# Patient Record
Sex: Male | Born: 1960
Health system: Southern US, Community
[De-identification: ages and names within clinical notes are randomized; demographics above are authoritative.]

---

## 2020-03-01 ENCOUNTER — Ambulatory Visit: Payer: Medicaid Other | Attending: Internal Medicine

## 2020-03-01 DIAGNOSIS — Z23 Encounter for immunization: Secondary | ICD-10-CM

## 2020-03-01 NOTE — Progress Notes (Signed)
   Covid-19 Vaccination Clinic  Name:  Zyan Coby    MRN: [id number] DOB: 03-13-1961  03/01/2020  Mr. Kappes was observed post Covid-19 immunization for 15 minutes without incident. He was provided with Vaccine Information Sheet and instruction to access the V-Safe system.   Mr. Kornegay was instructed to call 911 with any severe reactions post vaccine: Marland Kitchen Difficulty breathing  . Swelling of face and throat  . A fast heartbeat  . A bad rash all over body  . Dizziness and weakness   Immunizations Administered    Name Date Dose VIS Date Route   Pfizer COVID-19 Vaccine 03/01/2020 10:43 AM 0.3 mL 11/25/2019 Intramuscular   Manufacturer: [employer], [employer]   Lot: [id number]   NDC: [address]-9

## 2020-03-26 ENCOUNTER — Ambulatory Visit: Payer: Medicaid Other | Attending: Internal Medicine

## 2020-03-26 DIAGNOSIS — Z23 Encounter for immunization: Secondary | ICD-10-CM

## 2020-03-26 NOTE — Progress Notes (Signed)
   Covid-19 Vaccination Clinic  Name:  Alec Clark    MRN: [id number] DOB: [birth year]  03/26/2020  [name] was observed post Covid-19 immunization for 15 minutes without incident. He was provided with Vaccine Information Sheet and instruction to access the V-Safe system.   [name] was instructed to call 911 with any severe reactions post vaccine: Marland Kitchen Difficulty breathing  . Swelling of face and throat  . A fast heartbeat  . A bad rash all over body  . Dizziness and weakness   Immunizations Administered    Name Date Dose VIS Date Route   Pfizer COVID-19 Vaccine 03/26/2020 10:08 AM 0.3 mL 11/25/2019 Intramuscular   Manufacturer: [employer], [employer]   Lot: [id number]   NDC: [address]-6

## 2020-05-21 ENCOUNTER — Other Ambulatory Visit: Payer: Self-pay

## 2020-05-21 ENCOUNTER — Encounter: Payer: Self-pay | Admitting: Physician Assistant

## 2020-05-21 ENCOUNTER — Ambulatory Visit (INDEPENDENT_AMBULATORY_CARE_PROVIDER_SITE_OTHER): Payer: 59 | Admitting: Physician Assistant

## 2020-05-21 VITALS — BP 130/78 | HR 72 | Temp 97.9°F | Ht 67.5 in | Wt 181.6 lb

## 2020-05-21 DIAGNOSIS — J310 Chronic rhinitis: Secondary | ICD-10-CM

## 2020-05-21 DIAGNOSIS — G44209 Tension-type headache, unspecified, not intractable: Secondary | ICD-10-CM | POA: Diagnosis not present

## 2020-05-21 MED ORDER — AZELASTINE HCL 0.1 % NA SOLN: 2.0000 | Freq: Two times a day (BID) | NASAL | 1 refills | Status: DC

## 2020-05-21 NOTE — Progress Notes (Signed)
Patient presents to clinic today to establish care.  Acute Concerns: Patient notes significant issue with nasal and sinus congestion since moving her to [place] from [place]. Notes having daily nasal congestion, sinus pressure, rhinorrhea and PND. Occasional watery eyes. Denies chest tightness or wheezing. Denies SOB or chest pain. Will have frequent headaches, some described as frontal with sinus pain and pain with leaning forward. Others are in the R occipital region and associated with significant tightness in the neck and shoulder. Denies vision changes, photophobia, nausea or vomiting. Has taken OTC antihistamines with only some improvement in symptoms.  Health Maintenance: Immunizations -- Will obtain records Colonoscopy -- Overdue. Average risk. Is unsure of proceeding with screening.   History reviewed. No pertinent past medical history.  History reviewed. No pertinent surgical history.  No current outpatient medications on file prior to visit.   No current facility-administered medications on file prior to visit.    Not on File  History reviewed. No pertinent family history.  Social History   Socioeconomic History  . Marital status: Married    Spouse name: Not on file  . Number of children: Not on file  . Years of education: Not on file  . Highest education level: Not on file  Occupational History  . Not on file  Tobacco Use  . Smoking status: Former Smoker    Packs/day: 1.00    Types: Cigarettes  . Smokeless tobacco: Never Used  Substance and Sexual Activity  . Alcohol use: Yes    Alcohol/week: 2.0 standard drinks    Types: 2 Cans of beer per week  . Drug use: Not Currently    Types: Marijuana  . Sexual activity: Not on file  Other Topics Concern  . Not on file  Social History Narrative  . Not on file   Social Determinants of Health   Financial Resource Strain:   . Difficulty of Paying Living Expenses:   Food Insecurity:   . Worried About Brewing technologist in the Last Year:   . Barista in the Last Year:   Transportation Needs:   . Freight forwarder (Medical):   Marland Kitchen Lack of Transportation (Non-Medical):   Physical Activity:   . Days of Exercise per Week:   . Minutes of Exercise per Session:   Stress:   . Feeling of Stress :   Social Connections:   . Frequency of Communication with Friends and Family:   . Frequency of Social Gatherings with Friends and Family:   . Attends Religious Services:   . Active Member of Clubs or Organizations:   . Attends Banker Meetings:   Marland Kitchen Marital Status:   Intimate Partner Violence:   . Fear of Current or Ex-Partner:   . Emotionally Abused:   Marland Kitchen Physically Abused:   . Sexually Abused:     ROS  Pertinent ROS are listed in the HPI.  There were no vitals taken for this visit.  Physical Exam Vitals reviewed.  Constitutional:      Appearance: Normal appearance.  HENT:     Head: Normocephalic and atraumatic.     Right Ear: Tympanic membrane normal.     Left Ear: Tympanic membrane normal.     Nose: Congestion and rhinorrhea present.     Mouth/Throat:     Mouth: Mucous membranes are moist.  Eyes:     Conjunctiva/sclera: Conjunctivae normal.     Pupils: Pupils are equal, round, and reactive to light.  Cardiovascular:  Rate and Rhythm: Normal rate and regular rhythm.     Pulses: Normal pulses.     Heart sounds: Normal heart sounds.  Pulmonary:     Effort: Pulmonary effort is normal.     Breath sounds: Normal breath sounds.  Musculoskeletal:     Cervical back: Neck supple.  Neurological:     Mental Status: He is alert. Mental status is at baseline.  Psychiatric:        Mood and Affect: Mood normal.    Assessment/Plan: 1. Chronic rhinitis Worsened since move to [place]. Continue daily antihistamine, Xyzal recommended. Start saline nasal rinse. Consider restarting OTC nasal steroid. Rx Astelin for a 7-10 day trial. Follow-up via phone at that time to let us know how  things are. May need to consider addition of singulair versus assessment with Allergist.   2. Tension headache Significant tension in R trapezius as most likely trigger. Exam otherwise unremarkable. BP stable. PT recommended. He will give thought to this. Start heating pad. Stretching exercises reviewed and demonstrated for him to start twice daily. Massage recommended. Follow-up if not improving.   This visit occurred during the SARS-CoV-2 public health emergency.  Safety protocols were in place, including screening questions prior to the visit, additional usage of staff PPE, and extensive cleaning of exam room while observing appropriate contact time as indicated for disinfecting solutions.      Leeanne Rio, PA-C

## 2020-05-21 NOTE — Patient Instructions (Signed)
Please start a daily saline nasal rinse as discussed. Continue daily Zyrtec or you can try OTC Xyzal. Start the Astelin daily over the next couple of weeks. Let me know if there is not a huge improvement in symptoms.   I think headaches are combination of allergic inflammation in the sinuses and from tension. Your R trapezius muscle is very tense. Start heating pad to the area for 10-15 minutes twice daily.  Apply topical Icy Hot or BioFreeze to the area (do not put the heating pad on when this is on the body).  Start the stretches we discussed in office. If not improving, we should proceed with physical therapy.   Here are some other exercise for your lower back when it flares:   Sciatica Rehab Ask your health care provider which exercises are safe for you. Do exercises exactly as told by your health care provider and adjust them as directed. It is normal to feel mild stretching, pulling, tightness, or discomfort as you do these exercises. Stop right away if you feel sudden pain or your pain gets worse. Do not begin these exercises until told by your health care provider. Stretching and range-of-motion exercises These exercises warm up your muscles and joints and improve the movement and flexibility of your hips and back. These exercises also help to relieve pain, numbness, and tingling. Sciatic nerve glide 1. Sit in a chair with your head facing down toward your chest. Place your hands behind your back. Let your shoulders slump forward. 2. Slowly straighten one of your legs while you tilt your head back as if you are looking toward the ceiling. Only straighten your leg as far as you can without making your symptoms worse. 3. Hold this position for __________ seconds. 4. Slowly return to the starting position. 5. Repeat with your other leg. Repeat __________ times. Complete this exercise __________ times a day. Knee to chest with hip adduction and internal rotation  1. Lie on your back on  a firm surface with both legs straight. 2. Bend one of your knees and move it up toward your chest until you feel a gentle stretch in your lower back and buttock. Then, move your knee toward the shoulder that is on the opposite side from your leg. This is hip adduction and internal rotation. ? Hold your leg in this position by holding on to the front of your knee. 3. Hold this position for __________ seconds. 4. Slowly return to the starting position. 5. Repeat with your other leg. Repeat __________ times. Complete this exercise __________ times a day. Prone extension on elbows  1. Lie on your abdomen on a firm surface. A bed may be too soft for this exercise. 2. Prop yourself up on your elbows. 3. Use your arms to help lift your chest up until you feel a gentle stretch in your abdomen and your lower back. ? This will place some of your body weight on your elbows. If this is uncomfortable, try stacking pillows under your chest. ? Your hips should stay down, against the surface that you are lying on. Keep your hip and back muscles relaxed. 4. Hold this position for __________ seconds. 5. Slowly relax your upper body and return to the starting position. Repeat __________ times. Complete this exercise __________ times a day. Strengthening exercises These exercises build strength and endurance in your back. Endurance is the ability to use your muscles for a long time, even after they get tired. Pelvic tilt This exercise strengthens  the muscles that lie deep in the abdomen. 1. Lie on your back on a firm surface. Bend your knees and keep your feet flat on the floor. 2. Tense your abdominal muscles. Tip your pelvis up toward the ceiling and flatten your lower back into the floor. ? To help with this exercise, you may place a small towel under your lower back and try to push your back into the towel. 3. Hold this position for __________ seconds. 4. Let your muscles relax completely before you repeat  this exercise. Repeat __________ times. Complete this exercise __________ times a day. Alternating arm and leg raises  1. Get on your hands and knees on a firm surface. If you are on a hard floor, you may want to use padding, such as an exercise mat, to cushion your knees. 2. Line up your arms and legs. Your hands should be directly below your shoulders, and your knees should be directly below your hips. 3. Lift your left leg behind you. At the same time, raise your right arm and straighten it in front of you. ? Do not lift your leg higher than your hip. ? Do not lift your arm higher than your shoulder. ? Keep your abdominal and back muscles tight. ? Keep your hips facing the ground. ? Do not arch your back. ? Keep your balance carefully, and do not hold your breath. 4. Hold this position for __________ seconds. 5. Slowly return to the starting position. 6. Repeat with your right leg and your left arm. Repeat __________ times. Complete this exercise __________ times a day. Posture and body mechanics Good posture and healthy body mechanics can help to relieve stress in your body's tissues and joints. Body mechanics refers to the movements and positions of your body while you do your daily activities. Posture is part of body mechanics. Good posture means:  Your spine is in its natural S-curve position (neutral).  Your shoulders are pulled back slightly.  Your head is not tipped forward. Follow these guidelines to improve your posture and body mechanics in your everyday activities. Standing   When standing, keep your spine neutral and your feet about hip width apart. Keep a slight bend in your knees. Your ears, shoulders, and hips should line up.  When you do a task in which you stand in one place for a long time, place one foot up on a stable object that is 2-4 inches (5-10 cm) high, such as a footstool. This helps keep your spine neutral. Sitting   When sitting, keep your spine  neutral and keep your feet flat on the floor. Use a footrest, if necessary, and keep your thighs parallel to the floor. Avoid rounding your shoulders, and avoid tilting your head forward.  When working at a desk or a computer, keep your desk at a height where your hands are slightly lower than your elbows. Slide your chair under your desk so you are close enough to maintain good posture.  When working at a computer, place your monitor at a height where you are looking straight ahead and you do not have to tilt your head forward or downward to look at the screen. Resting  When lying down and resting, avoid positions that are most painful for you.  If you have pain with activities such as sitting, bending, stooping, or squatting, lie in a position in which your body does not bend very much. For example, avoid curling up on your side with your arms and  knees near your chest (fetal position).  If you have pain with activities such as standing for a long time or reaching with your arms, lie with your spine in a neutral position and bend your knees slightly. Try the following positions: ? Lying on your side with a pillow between your knees. ? Lying on your back with a pillow under your knees. Lifting   When lifting objects, keep your feet at least shoulder width apart and tighten your abdominal muscles.  Bend your knees and hips and keep your spine neutral. It is important to lift using the strength of your legs, not your back. Do not lock your knees straight out.  Always ask for help to lift heavy or awkward objects. This information is not intended to replace advice given to you by your health care provider. Make sure you discuss any questions you have with your health care provider. Document Revised: 03/25/2019 Document Reviewed: 12/23/2018 Elsevier Patient Education  2020 [employer].

## 2020-11-14 ENCOUNTER — Other Ambulatory Visit: Payer: Self-pay

## 2020-11-14 ENCOUNTER — Encounter: Payer: Self-pay | Admitting: Physician Assistant

## 2020-11-14 ENCOUNTER — Telehealth (INDEPENDENT_AMBULATORY_CARE_PROVIDER_SITE_OTHER): Payer: 59 | Admitting: Physician Assistant

## 2020-11-14 DIAGNOSIS — L739 Follicular disorder, unspecified: Secondary | ICD-10-CM | POA: Diagnosis not present

## 2020-11-14 DIAGNOSIS — J069 Acute upper respiratory infection, unspecified: Secondary | ICD-10-CM | POA: Diagnosis not present

## 2020-11-14 MED ORDER — FLUTICASONE PROPIONATE 50 MCG/ACT NA SUSP: 2.0000 | Freq: Every day | NASAL | 0 refills | Status: DC

## 2020-11-14 MED ORDER — DOXYCYCLINE HYCLATE 100 MG PO TABS: 100.0000 mg | ORAL_TABLET | Freq: Two times a day (BID) | ORAL | 0 refills | Status: DC

## 2020-11-14 NOTE — Progress Notes (Signed)
   Virtual Visit via Video   I connected with patient on 11/14/20 at 10:30 AM EST by a video enabled telemedicine application and verified that I am speaking with the correct person using two identifiers.  Location patient: Home Location provider: [facility], [facility] Persons participating in the virtual visit: Patient, Provider, CMA (Patina Moore)  I discussed the limitations of evaluation and management by telemedicine and the availability of in person appointments. The patient expressed understanding and agreed to proceed.  Subjective:   HPI:   Patient presents via [facility] today c/o 4-5 days of nasal congestion, postnasal drip, headache and dry cough. Initially with some chest congestion but this has imrpoved. Denies sinus pain, ear pain or tooth pain. Denies chest tightness or SOB. Notes symptoms worsening over past couple of days. Is taking Mucinex, Allegra and Astelin. Denies recent travel. Has had all 3 COVID vaccines.   Patient also notes painful bumps of posterior scalp that he notes are small and with slight drainage when being scratched. Has been applying a medicated shampoo to the area without much improvement.   ROS:   See pertinent positives and negatives per HPI.  There are no problems to display for this patient.   Social History   Tobacco Use  . Smoking status: Former Smoker    Packs/day: 1.00    Types: Cigarettes  . Smokeless tobacco: Never Used  Substance Use Topics  . Alcohol use: Yes    Alcohol/week: 2.0 standard drinks    Types: 2 Cans of beer per week    Current Outpatient Medications:  .  azelastine (ASTELIN) 0.1 % nasal spray, Place 2 sprays into both nostrils 2 (two) times daily. Use in each nostril as directed, Disp: 30 mL, Rfl: 1  No Known Allergies  Objective:   There were no vitals taken for this visit.  Patient is well-developed, well-nourished in no acute distress.  Resting comfortably at home.  Head is normocephalic,  atraumatic.  No labored breathing.  Speech is clear and coherent with logical content.  Patient is alert and oriented at baseline.   Assessment and Plan:   1. Viral URI with cough Discussed with patient likely viral but cannot rule out potential for start of bacterial bronchitis. Low COVID risk. Supportive measures and OTC medications discussed. Begin Flonase in addition to Astelin. Giving Doxycycline for folliculitis which would cover bacterial URI as well. Strict return precautions discussed with patient.   2. Folliculitis Home care instructions reviewed. Recommend avoiding use of hats. Astringent PRN recommended to the area. Rx doxycycline. Follow-up if symptoms are not resolving.     Piedad Climes, PA-C 11/14/2020

## 2020-11-14 NOTE — Progress Notes (Signed)
I have discussed the procedure for the virtual visit with the patient who has given consent to proceed with assessment and treatment.   Alec Clark, CMA     

## 2020-11-20 ENCOUNTER — Telehealth: Payer: 59 | Admitting: Physician Assistant

## 2021-03-26 ENCOUNTER — Other Ambulatory Visit: Payer: Self-pay

## 2021-03-26 ENCOUNTER — Ambulatory Visit: Payer: Federal, State, Local not specified - PPO | Admitting: Nurse Practitioner

## 2021-03-26 ENCOUNTER — Encounter: Payer: Self-pay | Admitting: Nurse Practitioner

## 2021-03-26 VITALS — BP 138/78 | HR 69 | Temp 98.7°F | Ht 69.0 in | Wt 188.0 lb

## 2021-03-26 DIAGNOSIS — Z0001 Encounter for general adult medical examination with abnormal findings: Secondary | ICD-10-CM | POA: Diagnosis not present

## 2021-03-26 DIAGNOSIS — M25511 Pain in right shoulder: Secondary | ICD-10-CM | POA: Diagnosis not present

## 2021-03-26 DIAGNOSIS — R21 Rash and other nonspecific skin eruption: Secondary | ICD-10-CM | POA: Diagnosis not present

## 2021-03-26 DIAGNOSIS — L02419 Cutaneous abscess of limb, unspecified: Secondary | ICD-10-CM | POA: Insufficient documentation

## 2021-03-26 DIAGNOSIS — Z136 Encounter for screening for cardiovascular disorders: Secondary | ICD-10-CM | POA: Diagnosis not present

## 2021-03-26 DIAGNOSIS — Z0000 Encounter for general adult medical examination without abnormal findings: Secondary | ICD-10-CM

## 2021-03-26 LAB — LIPID PANEL

## 2021-03-26 MED ORDER — CEPHALEXIN 500 MG PO CAPS: 500.0000 mg | ORAL_CAPSULE | Freq: Two times a day (BID) | ORAL | 0 refills | Status: DC

## 2021-03-26 MED ORDER — PREDNISONE 10 MG (21) PO TBPK: ORAL_TABLET | ORAL | 0 refills | Status: DC

## 2021-03-26 MED ORDER — IBUPROFEN 600 MG PO TABS: 600.0000 mg | ORAL_TABLET | Freq: Three times a day (TID) | ORAL | 0 refills | Status: DC | PRN

## 2021-03-26 NOTE — Assessment & Plan Note (Signed)
Patient is reporting lesion like a rash on the left ear that has been present for a few months.  Patient reports sometimes lesions will bleed in each and mildly painful.  Referral to dermatology completed.  Follow-up with worsening unresolved symptoms.

## 2021-03-26 NOTE — Progress Notes (Signed)
New Alec Clark Note  RE: Alec Clark MRN: [id number] DOB: [birth year] Date of Office Visit: 03/26/2021  Chief Complaint: Establish Care and Shoulder Pain  History of Present Illness: .   Encounter for general adult medical examination without abnormal findings  Physical : Alec Clark's last physical exam was 2 year ago .  Weight: Appropriate for height (BMI greater than 27%) ;  Blood Pressure: Normal (BP less than 120/80) ; 138/78 Medical History: Alec Clark history reviewed ; Family history reviewed ;  Allergies Reviewed: No change in current allergies ;  Medications Reviewed: Medications reviewed - no changes ;  Lipids: Normal lipid levels ; labs completed Smoking: Life-long non-smoker ;  Physical Activity: Exercises at least 3 times per week ; No Alcohol/Drug Use: Is a social-drinker ; No illicit drug use ;   Safety: reviewed ; Alec Clark wears a seat belt, has smoke detectors, has carbon monoxide detectors and wears sunscreen with extended sun exposure. Dental Care: biannual cleanings, brushes and flosses daily. Ophthalmology/Optometry: Annual visit.  Hearing loss: none Vision impairments: none   Pain  Alec Clark reports recurrent right shoulder pain. was not an injury that may have caused the pain. The pain started about a month ago and is staying constant. The pain does radiate down arms The pain is described as aching, soreness and tingling, is mild in intensity, occurring intermittently. Symptoms are worse in the: last all day   Aggravating factors: movement Relieving factors: none.  Alec Clark has tried acetaminophen with no relief.   ---------------------------------------------------------------------------------------------------   Assessment and Plan: Alec Clark is a 60 y.o. male with: Annual physical exam Completed head to toe assessment.  Provided education on health maintenance and preventative care.  Completed labs-lipid panel, CBC, CMP, results pending.  Follow-up physical 1  year.  Armpit abscess Bilateral armpits abscess, recurrent and painful in the last few months.  After assessment Alec Clark skin is raw and painful.  Started Alec Clark on Keflex.  Advised Alec Clark to use shaving creams instead of blades to avoid ingrown hair.  Follow-up with worsening unresolved symptoms.  Right shoulder pain Uncontrolled right shoulder pain.  Provided education to Alec Clark with printed handouts given.  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  Started Alec Clark on ibuprofen 600 mg tablet, prednisone taper.  Advised to use warm compress and follow-up with worsening unresolved symptoms.  Rash Alec Clark is reporting lesion like a rash on the left ear that has been present for a few months.  Alec Clark reports sometimes lesions will bleed in each and mildly painful.  Referral to dermatology completed.  Follow-up with worsening unresolved symptoms.  No follow-ups on file.   Diagnostics:   Past Medical History: Alec Clark Active Problem List   Diagnosis Date Noted  . Annual physical exam 03/26/2021  . Right shoulder pain 03/26/2021  . Rash 03/26/2021  . Armpit abscess 03/26/2021   History reviewed. No pertinent past medical history. Past Surgical History: History reviewed. No pertinent surgical history. Medication List:  Current Outpatient Medications  Medication Sig Dispense Refill  . cephALEXin (KEFLEX) 500 MG capsule Take 1 capsule (500 mg total) by mouth 2 (two) times daily. 14 capsule 0  . ibuprofen (ADVIL) 600 MG tablet Take 1 tablet (600 mg total) by mouth every 8 (eight) hours as needed. 30 tablet 0  . predniSONE (STERAPRED UNI-PAK 21 TAB) 10 MG (21) TBPK tablet 6 tablet day 1, 5 tab day 2, 4 tablet day 3, 3 tablet day 4, 2 tablet day 5, 1 tablet day 6 1 each 0  No current facility-administered medications for this visit.   Allergies: No Known Allergies Social History: Social History   Socioeconomic History  . Marital status: Married    Spouse  name: Not on file  . Number of children: Not on file  . Years of education: Not on file  . Highest education level: Not on file  Occupational History  . Not on file  Tobacco Use  . Smoking status: Former Smoker    Packs/day: 1.00    Types: Cigarettes  . Smokeless tobacco: Never Used  Vaping Use  . Vaping Use: Never used  Substance and Sexual Activity  . Alcohol use: Yes    Alcohol/week: 2.0 standard drinks    Types: 2 Cans of beer per week  . Drug use: Never  . Sexual activity: Not on file  Other Topics Concern  . Not on file  Social History Narrative  . Not on file   Social Determinants of Health   Financial Resource Strain: Not on file  Food Insecurity: Not on file  Transportation Needs: Not on file  Physical Activity: Not on file  Stress: Not on file  Social Connections: Not on file       Family History: Family History  Problem Relation Age of Onset  . Hypertension Mother   . Stroke Mother   . Alcohol abuse Brother   . Drug abuse Brother          Review of Systems  Constitutional: Negative.   HENT: Negative.   Respiratory: Negative.   Cardiovascular: Negative.   Genitourinary: Negative.   Musculoskeletal: Negative for neck pain and neck stiffness.       Right shoulder pain  Skin: Negative.   Psychiatric/Behavioral: Negative.   All other systems reviewed and are negative.  Objective: BP 138/78   Pulse 69   Temp 98.7 F (37.1 C) (Temporal)   Ht 5\' 9"  (1.753 m)   Wt 188 lb (85.3 kg)   SpO2 96%   BMI 27.76 kg/m  Body mass index is 27.76 kg/m. Physical Exam Vitals reviewed.  Constitutional:      Appearance: Normal appearance.  HENT:     Head: Normocephalic.     Right Ear: Ear canal and external ear normal. There is no impacted cerumen.     Left Ear: Ear canal and external ear normal. There is no impacted cerumen.     Nose: Nose normal.     Mouth/Throat:     Mouth: Mucous membranes are moist.     Pharynx: Oropharynx is clear.  Eyes:      Conjunctiva/sclera: Conjunctivae normal.  Cardiovascular:     Rate and Rhythm: Normal rate and regular rhythm.     Pulses: Normal pulses.     Heart sounds: Normal heart sounds.  Pulmonary:     Effort: Pulmonary effort is normal.     Breath sounds: Normal breath sounds.  Abdominal:     General: Bowel sounds are normal.  Skin:    General: Skin is warm.  Neurological:     Mental Status: Alec Clark is alert and oriented to person, place, and time.    The plan was reviewed with the Alec Clark/family, and all questions/concerned were addressed.  It was my pleasure to see Alec Clark today and participate in his care. Please feel free to contact me with any questions or concerns.  Sincerely,  Alecia Lemming NP [facility]

## 2021-03-26 NOTE — Assessment & Plan Note (Signed)
Uncontrolled right shoulder pain.  Provided education to patient with printed handouts given.  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  Started patient on ibuprofen 600 mg tablet, prednisone taper.  Advised to use warm compress and follow-up with worsening unresolved symptoms.

## 2021-03-26 NOTE — Assessment & Plan Note (Signed)
Bilateral armpits abscess, recurrent and painful in the last few months.  After assessment patient skin is raw and painful.  Started patient on Keflex.  Advised patient to use shaving creams instead of blades to avoid ingrown hair.  Follow-up with worsening unresolved symptoms.

## 2021-03-26 NOTE — Patient Instructions (Signed)
Shoulder Pain Many things can cause shoulder pain, including:  An injury.  Moving the shoulder in the same way again and again (overuse).  Joint pain (arthritis). Pain can come from:  Swelling and irritation (inflammation) of any part of the shoulder.  An injury to the shoulder joint.  An injury to: ? Tissues that connect muscle to bone (tendons). ? Tissues that connect bones to each other (ligaments). ? Bones. Follow these instructions at home: Watch for changes in your symptoms. Let your doctor know about them. Follow these instructions to help with your pain. If you have a sling:  Wear the sling as told by your doctor. Remove it only as told by your doctor.  Loosen the sling if your fingers: ? Tingle. ? Become numb. ? Turn cold and blue.  Keep the sling clean.  If the sling is not waterproof: ? Do not let it get wet. ? Take the sling off when you shower or bathe. Managing pain, stiffness, and swelling  If told, put ice on the painful area: ? Put ice in a plastic bag. ? Place a towel between your skin and the bag. ? Leave the ice on for 20 minutes, 2-3 times a day. Stop putting ice on if it does not help with the pain.  Squeeze a soft ball or a foam pad as much as possible. This prevents swelling in the shoulder. It also helps to strengthen the arm.   General instructions  Take over-the-counter and prescription medicines only as told by your doctor.  Keep all follow-up visits as told by your doctor. This is important. Contact a doctor if:  Your pain gets worse.  Medicine does not help your pain.  You have new pain in your arm, hand, or fingers. Get help right away if:  Your arm, hand, or fingers: ? Tingle. ? Are numb. ? Are swollen. ? Are painful. ? Turn white or blue. Summary  Shoulder pain can be caused by many things. These include injury, moving the shoulder in the same away again and again, and joint pain.  Watch for changes in your symptoms.  Let your doctor know about them.  This condition may be treated with a sling, ice, and pain medicine.  Contact your doctor if the pain gets worse or you have new pain. Get help right away if your arm, hand, or fingers tingle or get numb, swollen, or painful.  Keep all follow-up visits as told by your doctor. This is important. This information is not intended to replace advice given to you by your health care provider. Make sure you discuss any questions you have with your health care provider. Document Revised: 06/15/2018 Document Reviewed: 06/15/2018 Elsevier Patient Education  2021 [employer] Health Maintenance, Male Adopting a healthy lifestyle and getting preventive care are important in promoting health and wellness. Ask your health care provider about:  The right schedule for you to have regular tests and exams.  Things you can do on your own to prevent diseases and keep yourself healthy. What should I know about diet, weight, and exercise? Eat a healthy diet  Eat a diet that includes plenty of vegetables, fruits, low-fat dairy products, and lean protein.  Do not eat a lot of foods that are high in solid fats, added sugars, or sodium.   Maintain a healthy weight Body mass index (BMI) is a measurement that can be used to identify possible weight problems. It estimates body fat based on height and weight. Your health  care provider can help determine your BMI and help you achieve or maintain a healthy weight. Get regular exercise Get regular exercise. This is one of the most important things you can do for your health. Most adults should:  Exercise for at least 150 minutes each week. The exercise should increase your heart rate and make you sweat (moderate-intensity exercise).  Do strengthening exercises at least twice a week. This is in addition to the moderate-intensity exercise.  Spend less time sitting. Even light physical activity can be beneficial. Watch cholesterol and  blood lipids Have your blood tested for lipids and cholesterol at 61 years of age, then have this test every 5 years. You may need to have your cholesterol levels checked more often if:  Your lipid or cholesterol levels are high.  You are older than 61 years of age.  You are at high risk for heart disease. What should I know about cancer screening? Many types of cancers can be detected early and may often be prevented. Depending on your health history and family history, you may need to have cancer screening at various ages. This may include screening for:  Colorectal cancer.  Prostate cancer.  Skin cancer.  Lung cancer. What should I know about heart disease, diabetes, and high blood pressure? Blood pressure and heart disease  High blood pressure causes heart disease and increases the risk of stroke. This is more likely to develop in people who have high blood pressure readings, are of African descent, or are overweight.  Talk with your health care provider about your target blood pressure readings.  Have your blood pressure checked: ? Every 3-5 years if you are 28-58 years of age. ? Every year if you are 51 years old or older.  If you are between the ages of 27 and 48 and are a current or former smoker, ask your health care provider if you should have a one-time screening for abdominal aortic aneurysm (AAA). Diabetes Have regular diabetes screenings. This checks your fasting blood sugar level. Have the screening done:  Once every three years after age 7 if you are at a normal weight and have a low risk for diabetes.  More often and at a younger age if you are overweight or have a high risk for diabetes. What should I know about preventing infection? Hepatitis B If you have a higher risk for hepatitis B, you should be screened for this virus. Talk with your health care provider to find out if you are at risk for hepatitis B infection. Hepatitis C Blood testing is  recommended for:  Everyone born from [birth year] through 1966.  Anyone with known risk factors for hepatitis C. Sexually transmitted infections (STIs)  You should be screened each year for STIs, including gonorrhea and chlamydia, if: ? You are sexually active and are younger than 61 years of age. ? You are older than 61 years of age and your health care provider tells you that you are at risk for this type of infection. ? Your sexual activity has changed since you were last screened, and you are at increased risk for chlamydia or gonorrhea. Ask your health care provider if you are at risk.  Ask your health care provider about whether you are at high risk for HIV. Your health care provider may recommend a prescription medicine to help prevent HIV infection. If you choose to take medicine to prevent HIV, you should first get tested for HIV. You should then be tested every 3  months for as long as you are taking the medicine. Follow these instructions at home: Lifestyle  Do not use any products that contain nicotine or tobacco, such as cigarettes, e-cigarettes, and chewing tobacco. If you need help quitting, ask your health care provider.  Do not use street drugs.  Do not share needles.  Ask your health care provider for help if you need support or information about quitting drugs. Alcohol use  Do not drink alcohol if your health care provider tells you not to drink.  If you drink alcohol: ? Limit how much you have to 0-2 drinks a day. ? Be aware of how much alcohol is in your drink. In the [place] one drink equals one 12 oz bottle of beer (355 mL), one 5 oz glass of wine (148 mL), or one 1 oz glass of hard liquor (44 mL). General instructions  Schedule regular health, dental, and eye exams.  Stay current with your vaccines.  Tell your health care provider if: ? You often feel depressed. ? You have ever been abused or do not feel safe at home. Summary  Adopting a healthy lifestyle and  getting preventive care are important in promoting health and wellness.  Follow your health care provider's instructions about healthy diet, exercising, and getting tested or screened for diseases.  Follow your health care provider's instructions on monitoring your cholesterol and blood pressure. This information is not intended to replace advice given to you by your health care provider. Make sure you discuss any questions you have with your health care provider. Document Revised: 11/24/2018 Document Reviewed: 11/24/2018 Elsevier Patient Education  2021 [employer].

## 2021-03-26 NOTE — Assessment & Plan Note (Signed)
Completed head to toe assessment.  Provided education on health maintenance and preventative care.  Completed labs-lipid panel, CBC, CMP, results pending.  Follow-up physical 1 year.

## 2021-03-27 LAB — CBC WITH DIFFERENTIAL/PLATELET
Basophils Absolute: 0 10*3/uL (ref 0.0–0.2)
Basos: 1 %
EOS (ABSOLUTE): 0.1 10*3/uL (ref 0.0–0.4)
Eos: 2 %
Hematocrit: 45.5 % (ref 37.5–51.0)
Hemoglobin: 15.3 g/dL (ref 13.0–17.7)
Immature Grans (Abs): 0 10*3/uL (ref 0.0–0.1)
Immature Granulocytes: 1 %
Lymphocytes Absolute: 1.5 10*3/uL (ref 0.7–3.1)
Lymphs: 24 %
MCH: 31.1 pg (ref 26.6–33.0)
MCHC: 33.6 g/dL (ref 31.5–35.7)
MCV: 93 fL (ref 79–97)
Monocytes Absolute: 0.6 10*3/uL (ref 0.1–0.9)
Monocytes: 9 %
Neutrophils Absolute: 3.9 10*3/uL (ref 1.4–7.0)
Neutrophils: 63 %
Platelets: 228 10*3/uL (ref 150–450)
RBC: 4.92 x10E6/uL (ref 4.14–5.80)
RDW: 12.5 % (ref 11.6–15.4)
WBC: 6.1 10*3/uL (ref 3.4–10.8)

## 2021-03-27 LAB — LIPID PANEL
Chol/HDL Ratio: 6 ratio — ABNORMAL HIGH (ref 0.0–5.0)
Cholesterol, Total: 336 mg/dL — ABNORMAL HIGH (ref 100–199)
HDL: 56 mg/dL (ref >39)
LDL Chol Calc (NIH): 253 mg/dL — ABNORMAL HIGH (ref 0–99)
Triglycerides: 143 mg/dL (ref 0–149)
VLDL Cholesterol Cal: 27 mg/dL (ref 5–40)

## 2021-03-27 LAB — COMPREHENSIVE METABOLIC PANEL
ALT: 34 IU/L (ref 0–44)
AST: 19 IU/L (ref 0–40)
Albumin/Globulin Ratio: 2.1 (ref 1.2–2.2)
Albumin: 4.6 g/dL (ref 3.8–4.9)
Alkaline Phosphatase: 70 IU/L (ref 44–121)
BUN/Creatinine Ratio: 12 (ref 10–24)
BUN: 12 mg/dL (ref 8–27)
Bilirubin Total: 1.1 mg/dL (ref 0.0–1.2)
CO2: 22 mmol/L (ref 20–29)
Calcium: 9.3 mg/dL (ref 8.6–10.2)
Chloride: 100 mmol/L (ref 96–106)
Creatinine, Ser: 0.97 mg/dL (ref 0.76–1.27)
Globulin, Total: 2.2 g/dL (ref 1.5–4.5)
Glucose: 112 mg/dL — ABNORMAL HIGH (ref 65–99)
Potassium: 4.7 mmol/L (ref 3.5–5.2)
Sodium: 139 mmol/L (ref 134–144)
Total Protein: 6.8 g/dL (ref 6.0–8.5)
eGFR: 89 mL/min/{1.73_m2} (ref >59)

## 2021-03-29 ENCOUNTER — Other Ambulatory Visit: Payer: Self-pay | Admitting: Nurse Practitioner

## 2021-03-29 DIAGNOSIS — E7800 Pure hypercholesterolemia, unspecified: Secondary | ICD-10-CM

## 2021-03-29 DIAGNOSIS — R7309 Other abnormal glucose: Secondary | ICD-10-CM

## 2021-03-29 MED ORDER — ROSUVASTATIN CALCIUM 20 MG PO TABS: 20.0000 mg | ORAL_TABLET | Freq: Every day | ORAL | 0 refills | Status: AC

## 2021-05-07 ENCOUNTER — Encounter: Payer: Self-pay | Admitting: Nurse Practitioner

## 2021-05-07 ENCOUNTER — Other Ambulatory Visit: Payer: Self-pay

## 2021-05-07 ENCOUNTER — Ambulatory Visit: Payer: Federal, State, Local not specified - PPO | Admitting: Nurse Practitioner

## 2021-05-07 ENCOUNTER — Ambulatory Visit (INDEPENDENT_AMBULATORY_CARE_PROVIDER_SITE_OTHER): Payer: Federal, State, Local not specified - PPO

## 2021-05-07 VITALS — BP 157/92 | HR 63 | Temp 98.7°F | Ht 69.0 in | Wt 190.0 lb

## 2021-05-07 DIAGNOSIS — M79644 Pain in right finger(s): Secondary | ICD-10-CM | POA: Diagnosis not present

## 2021-05-07 DIAGNOSIS — M25511 Pain in right shoulder: Secondary | ICD-10-CM

## 2021-05-07 DIAGNOSIS — Z139 Encounter for screening, unspecified: Secondary | ICD-10-CM

## 2021-05-07 DIAGNOSIS — M19011 Primary osteoarthritis, right shoulder: Secondary | ICD-10-CM | POA: Diagnosis not present

## 2021-05-07 MED ORDER — PREDNISONE 10 MG (21) PO TBPK
ORAL_TABLET | ORAL | 0 refills | Status: DC
Start: 2021-05-07 — End: 2021-10-01

## 2021-05-07 MED ORDER — IBUPROFEN 600 MG PO TABS: 600.0000 mg | ORAL_TABLET | Freq: Three times a day (TID) | ORAL | 0 refills | Status: DC | PRN

## 2021-05-07 MED ORDER — CYCLOBENZAPRINE HCL 5 MG PO TABS: 5.0000 mg | ORAL_TABLET | Freq: Three times a day (TID) | ORAL | 1 refills | Status: DC | PRN

## 2021-05-07 NOTE — Assessment & Plan Note (Signed)
Uncontrolled pain.  Radiating from shoulders.  Patient is reporting tingling and numbness intermittently.  Advised to take medication as prescribed anti-inflammatory [ibuprofen] and prednisone taper.  Follow-up for worsening or unresolved symptoms.

## 2021-05-07 NOTE — Patient Instructions (Signed)
Shoulder Pain Many things can cause shoulder pain, including:  An injury.  Moving the shoulder in the same way again and again (overuse).  Joint pain (arthritis). Pain can come from:  Swelling and irritation (inflammation) of any part of the shoulder.  An injury to the shoulder joint.  An injury to: ? Tissues that connect muscle to bone (tendons). ? Tissues that connect bones to each other (ligaments). ? Bones. Follow these instructions at home: Watch for changes in your symptoms. Let your doctor know about them. Follow these instructions to help with your pain. If you have a sling:  Wear the sling as told by your doctor. Remove it only as told by your doctor.  Loosen the sling if your fingers: ? Tingle. ? Become numb. ? Turn cold and blue.  Keep the sling clean.  If the sling is not waterproof: ? Do not let it get wet. ? Take the sling off when you shower or bathe. Managing pain, stiffness, and swelling  If told, put ice on the painful area: ? Put ice in a plastic bag. ? Place a towel between your skin and the bag. ? Leave the ice on for 20 minutes, 2-3 times a day. Stop putting ice on if it does not help with the pain.  Squeeze a soft ball or a foam pad as much as possible. This prevents swelling in the shoulder. It also helps to strengthen the arm.   General instructions  Take over-the-counter and prescription medicines only as told by your doctor.  Keep all follow-up visits as told by your doctor. This is important. Contact a doctor if:  Your pain gets worse.  Medicine does not help your pain.  You have new pain in your arm, hand, or fingers. Get help right away if:  Your arm, hand, or fingers: ? Tingle. ? Are numb. ? Are swollen. ? Are painful. ? Turn white or blue. Summary  Shoulder pain can be caused by many things. These include injury, moving the shoulder in the same away again and again, and joint pain.  Watch for changes in your symptoms.  Let your doctor know about them.  This condition may be treated with a sling, ice, and pain medicine.  Contact your doctor if the pain gets worse or you have new pain. Get help right away if your arm, hand, or fingers tingle or get numb, swollen, or painful.  Keep all follow-up visits as told by your doctor. This is important. This information is not intended to replace advice given to you by your health care provider. Make sure you discuss any questions you have with your health care provider. Document Revised: 06/15/2018 Document Reviewed: 06/15/2018 Elsevier Patient Education  2021 Elsevier Inc.  

## 2021-05-07 NOTE — Assessment & Plan Note (Signed)
symptoms not well controlled even after treatment with prednisone and ibuprofen.  Completed x-ray results pending Referral to orthopedic completed Repeat prednisone taper, continue ibuprofen as prescribed, Flexeril.  Education provided to patient with printed handouts given.  Rx sent to pharmacy.

## 2021-05-07 NOTE — Progress Notes (Signed)
Acute Office Visit  Subjective:    Patient ID: Alec Clark, male    DOB: [birth year], 60 y.o.   MRN: [id number]  Chief Complaint  Patient presents with  . Shoulder Pain  . thumb pain    HPI Patient is in today for Pain  He reports recurrent right shoulder /right thumb pain. was not an injury that may have caused the pain. The pain started about a month ago and is worsening. The pain does radiate right arm. The pain is described as aching, soreness, throbbing and tingling, is moderate and severe in intensity, occurring constantly. Symptoms are worse in the: all day  Aggravating factors: movement Relieving factors: ROM.  He has tried acetaminophen and NSAIDs with little relief.   ---------------------------------------------------------------------------------------------------   History reviewed. No pertinent past medical history.  History reviewed. No pertinent surgical history.  Family History  Problem Relation Age of Onset  . Hypertension Mother   . Stroke Mother   . Alcohol abuse Brother   . Drug abuse Brother     Social History   Socioeconomic History  . Marital status: Married    Spouse name: Not on file  . Number of children: Not on file  . Years of education: Not on file  . Highest education level: Not on file  Occupational History  . Not on file  Tobacco Use  . Smoking status: Former Smoker    Packs/day: 1.00    Types: Cigarettes  . Smokeless tobacco: Never Used  Vaping Use  . Vaping Use: Never used  Substance and Sexual Activity  . Alcohol use: Yes    Alcohol/week: 2.0 standard drinks    Types: 2 Cans of beer per week  . Drug use: Never  . Sexual activity: Not on file  Other Topics Concern  . Not on file  Social History Narrative  . Not on file   Social Determinants of Health   Financial Resource Strain: Not on file  Food Insecurity: Not on file  Transportation Needs: Not on file  Physical Activity: Not on file  Stress: Not on file   Social Connections: Not on file  Intimate Partner Violence: Not on file    Outpatient Medications Prior to Visit  Medication Sig Dispense Refill  . rosuvastatin (CRESTOR) 20 MG tablet Take 1 tablet (20 mg total) by mouth daily. 90 tablet 0  . cephALEXin (KEFLEX) 500 MG capsule Take 1 capsule (500 mg total) by mouth 2 (two) times daily. 14 capsule 0  . ibuprofen (ADVIL) 600 MG tablet Take 1 tablet (600 mg total) by mouth every 8 (eight) hours as needed. 30 tablet 0  . predniSONE (STERAPRED UNI-PAK 21 TAB) 10 MG (21) TBPK tablet 6 tablet day 1, 5 tab day 2, 4 tablet day 3, 3 tablet day 4, 2 tablet day 5, 1 tablet day 6 1 each 0   No facility-administered medications prior to visit.    No Known Allergies  Review of Systems  Constitutional: Negative.   HENT: Negative.   Respiratory: Negative.   Cardiovascular: Negative.   Gastrointestinal: Negative.   Genitourinary: Negative.   Musculoskeletal: Positive for joint swelling and neck pain.  All other systems reviewed and are negative.      Objective:    Physical Exam Vitals and nursing note reviewed.  Constitutional:      Appearance: Normal appearance.  HENT:     Head: Normocephalic.     Nose: Nose normal.  Eyes:     Conjunctiva/sclera: Conjunctivae normal.  Cardiovascular:     Rate and Rhythm: Normal rate and regular rhythm.     Pulses: Normal pulses.     Heart sounds: Normal heart sounds.  Pulmonary:     Effort: Pulmonary effort is normal.     Breath sounds: Normal breath sounds.  Abdominal:     General: Bowel sounds are normal.  Musculoskeletal:     Right hand: Swelling and tenderness present. Decreased range of motion.  Neurological:     Mental Status: He is alert and oriented to person, place, and time.     BP (!) 157/92   Pulse 63   Temp 98.7 F (37.1 C) (Temporal)   Ht 5' 9"  (1.753 m)   Wt 190 lb (86.2 kg)   SpO2 98%   BMI 28.06 kg/m  Wt Readings from Last 3 Encounters:  05/07/21 190 lb (86.2 kg)   03/26/21 188 lb (85.3 kg)  05/21/20 181 lb 9.6 oz (82.4 kg)    Health Maintenance Due  Topic Date Due  . HIV Screening  Never done  . Hepatitis C Screening  Never done  . TETANUS/TDAP  Never done  . COLONOSCOPY (Pts 45-82yr Insurance coverage will need to be confirmed)  Never done    There are no preventive care reminders to display for this patient.   No results found for: TSH Lab Results  Component Value Date   WBC 6.1 03/26/2021   HGB 15.3 03/26/2021   HCT 45.5 03/26/2021   MCV 93 03/26/2021   PLT 228 03/26/2021   Lab Results  Component Value Date   NA 139 03/26/2021   K 4.7 03/26/2021   CO2 22 03/26/2021   GLUCOSE 112 (H) 03/26/2021   BUN 12 03/26/2021   CREATININE 0.97 03/26/2021   BILITOT 1.1 03/26/2021   ALKPHOS 70 03/26/2021   AST 19 03/26/2021   ALT 34 03/26/2021   PROT 6.8 03/26/2021   ALBUMIN 4.6 03/26/2021   CALCIUM 9.3 03/26/2021   EGFR 89 03/26/2021   Lab Results  Component Value Date   CHOL 336 (H) 03/26/2021   Lab Results  Component Value Date   HDL 56 03/26/2021   Lab Results  Component Value Date   LDLCALC 253 (H) 03/26/2021   Lab Results  Component Value Date   TRIG 143 03/26/2021   Lab Results  Component Value Date   CHOLHDL 6.0 (H) 03/26/2021   No results found for: HGBA1C     Assessment & Plan:   Problem List Items Addressed This Visit      Other   Right shoulder pain     symptoms not well controlled even after treatment with prednisone and ibuprofen.  Completed x-ray results pending Referral to orthopedic completed Repeat prednisone taper, continue ibuprofen as prescribed, Flexeril.  Education provided to patient with printed handouts given.  Rx sent to pharmacy.       Relevant Medications   cyclobenzaprine (FLEXERIL) 5 MG tablet   ibuprofen (ADVIL) 600 MG tablet   predniSONE (STERAPRED UNI-PAK 21 TAB) 10 MG (21) TBPK tablet   Other Relevant Orders   DG Shoulder Right   Ambulatory referral to Orthopedic  Surgery   Pain of right thumb    Uncontrolled pain.  Radiating from shoulders.  Patient is reporting tingling and numbness intermittently.  Advised to take medication as prescribed anti-inflammatory [ibuprofen] and prednisone taper.  Follow-up for worsening or unresolved symptoms.       Other Visit Diagnoses    Screening due    -  Primary   Relevant Orders   Cologuard       Meds ordered this encounter  Medications  . cyclobenzaprine (FLEXERIL) 5 MG tablet    Sig: Take 1 tablet (5 mg total) by mouth 3 (three) times daily as needed for muscle spasms.    Dispense:  30 tablet    Refill:  1    Order Specific Question:   Supervising Provider    Answer:   Janora Norlander [[id number]]  . ibuprofen (ADVIL) 600 MG tablet    Sig: Take 1 tablet (600 mg total) by mouth every 8 (eight) hours as needed.    Dispense:  30 tablet    Refill:  0    Order Specific Question:   Supervising Provider    Answer:   Janora Norlander [[id number]]  . predniSONE (STERAPRED UNI-PAK 21 TAB) 10 MG (21) TBPK tablet    Sig: 6 tab day 1, 5 day 2, 4 day 3, 3 day 4, 2 day 5, 6 day 1    Dispense:  1 each    Refill:  0    Order Specific Question:   Supervising Provider    Answer:   Janora Norlander [[id number]]     Ivy Lynn, NP

## 2021-05-21 ENCOUNTER — Encounter: Payer: Self-pay | Admitting: Orthopedic Surgery

## 2021-05-21 ENCOUNTER — Ambulatory Visit: Payer: Federal, State, Local not specified - PPO

## 2021-05-21 ENCOUNTER — Other Ambulatory Visit: Payer: Self-pay

## 2021-05-21 ENCOUNTER — Ambulatory Visit (INDEPENDENT_AMBULATORY_CARE_PROVIDER_SITE_OTHER): Payer: Federal, State, Local not specified - PPO | Admitting: Orthopedic Surgery

## 2021-05-21 VITALS — BP 141/86 | HR 66 | Ht 69.0 in | Wt 183.0 lb

## 2021-05-21 DIAGNOSIS — M542 Cervicalgia: Secondary | ICD-10-CM

## 2021-05-21 DIAGNOSIS — M25511 Pain in right shoulder: Secondary | ICD-10-CM

## 2021-05-21 DIAGNOSIS — G8929 Other chronic pain: Secondary | ICD-10-CM

## 2021-05-21 DIAGNOSIS — M62838 Other muscle spasm: Secondary | ICD-10-CM

## 2021-05-21 DIAGNOSIS — R519 Headache, unspecified: Secondary | ICD-10-CM

## 2021-05-21 NOTE — Progress Notes (Signed)
New Patient Visit  Assessment: Alec Clark is a 60 y.o. male with the following: Neck pain, with trapezius muscle spasm on right    Plan: Patient has pain in his right trapezius muscle, which is tender to palpation.  Also some pain in his neck, with some headaches on a regular basis.  The pain is worsening, and he starting to have some discomfort in the left trapezius muscle as well.  Radiographs reviewed in clinic today demonstrate mild degenerative changes.  Although he was referred for shoulder pain, his right shoulder does not appear to be bothering him.  Radiographs are negative.  He has full range of motion with minimal discomfort, as well as excellent strength.  He has no signs of nerve compression, including numbness tingling, weakness or issues with his balance.  At this point, I think he would benefit from some physical therapy.  A referral was placed today.  No follow-up is needed, but if he has issues in the future, I have encouraged him return to clinic.   Follow-up: Return if symptoms worsen or fail to improve.  Subjective:  Chief Complaint  Patient presents with  . Shoulder Pain    Rt shoulder pain for 6 months, getting worse.     History of Present Illness: Alec Clark is a 60 y.o. male who has been referred to clinic today by Lynnell Chad, MD for evaluation of right shoulder pain.  He states has been having pain in the superior aspect of his shoulder for the past 6 months.  No specific injury.  He notes tenderness within his trapezius muscles.  He has no issues with range of motion of his right shoulder.  His pain tends to get worse throughout the day.  Overall, his pain is worsening.  He has occasional headaches, which he believes is attributed to his neck pain.  He is also starting to have some pain within the left side of his trapezius muscles.  He is taking ibuprofen occasionally for pain, with limited relief of his symptoms.  He has not worked with physical  therapy.  He has no issues with his balance.  Has no difficulty with small objects.  No differences in his handwriting currently.  He does occasionally get some radiating numbness tingling into his upper arm on the right.   Review of Systems: No fevers or chills Occasional numbness, tingling No chest pain No shortness of breath No bowel or bladder dysfunction No GI distress No headaches   Medical History:  History reviewed. No pertinent past medical history.  History reviewed. No pertinent surgical history.  Family History  Problem Relation Age of Onset  . Hypertension Mother   . Stroke Mother   . Alcohol abuse Brother   . Drug abuse Brother    Social History   Tobacco Use  . Smoking status: Former Smoker    Packs/day: 1.00    Types: Cigarettes  . Smokeless tobacco: Never Used  Vaping Use  . Vaping Use: Never used  Substance Use Topics  . Alcohol use: Yes    Alcohol/week: 2.0 standard drinks    Types: 2 Cans of beer per week  . Drug use: Never    No Known Allergies  Current Meds  Medication Sig  . rosuvastatin (CRESTOR) 20 MG tablet Take 1 tablet (20 mg total) by mouth daily.    Objective: BP (!) 141/86   Pulse 66   Ht 5\' 9"  (1.753 m)   Wt 183 lb (83 kg)   BMI  27.02 kg/m   Physical Exam:  General: Alert and oriented.  No acute distress Gait: Normal  Evaluation of right shoulder demonstrates no deformity.  No atrophy is appreciated.  He has tenderness to palpation within the right trapezius muscle.  Full range of motion of the right shoulder without discomfort.  His strength with supraspinatus, infraspinatus and subscapularis testing is 5/5.  He tolerates 90 degrees of external rotation and internal rotation in a fully abducted position.  Fingers are warm and well-perfused.  Sensation is intact distally.  He has good range of motion of his cervical spine, including flexion and extension.  He has worsening pain with rotation to the left.  No pain in his  right side of his neck with rotation of the right.  Negative Hoffmann's.    IMAGING: I personally ordered and reviewed the following images   Cervical spine x-rays were obtained in clinic today and demonstrates good overall alignment.  No evidence of listhesis.  There is no acute injuries noted.  Mild degenerative changes are noted at C5-6.  Well-maintained disc height throughout.  Impression: Mild degenerative changes of the cervical spine.  Right shoulder x-rays were previously obtained in clinic and demonstrates no acute findings.  No glenohumeral arthritis is appreciated.  No proximal humeral migration.  New Medications:  No orders of the defined types were placed in this encounter.     Oliver Barre, MD  05/21/2021 10:03 PM

## 2021-05-29 ENCOUNTER — Ambulatory Visit: Payer: Federal, State, Local not specified - PPO | Admitting: Physical Therapy

## 2021-06-18 LAB — COLOGUARD: Cologuard: NEGATIVE

## 2021-10-01 ENCOUNTER — Ambulatory Visit (INDEPENDENT_AMBULATORY_CARE_PROVIDER_SITE_OTHER): Payer: BC Managed Care – PPO

## 2021-10-01 ENCOUNTER — Encounter: Payer: Self-pay | Admitting: Nurse Practitioner

## 2021-10-01 ENCOUNTER — Other Ambulatory Visit: Payer: Self-pay

## 2021-10-01 ENCOUNTER — Ambulatory Visit: Payer: BC Managed Care – PPO | Admitting: Nurse Practitioner

## 2021-10-01 VITALS — BP 125/78 | HR 63 | Temp 97.6°F | Ht 69.0 in | Wt 184.0 lb

## 2021-10-01 DIAGNOSIS — M25511 Pain in right shoulder: Secondary | ICD-10-CM | POA: Diagnosis not present

## 2021-10-01 DIAGNOSIS — M545 Low back pain, unspecified: Secondary | ICD-10-CM | POA: Diagnosis not present

## 2021-10-01 DIAGNOSIS — M5430 Sciatica, unspecified side: Secondary | ICD-10-CM | POA: Diagnosis not present

## 2021-10-01 DIAGNOSIS — J029 Acute pharyngitis, unspecified: Secondary | ICD-10-CM | POA: Insufficient documentation

## 2021-10-01 DIAGNOSIS — M549 Dorsalgia, unspecified: Secondary | ICD-10-CM | POA: Diagnosis not present

## 2021-10-01 LAB — CULTURE, GROUP A STREP

## 2021-10-01 LAB — RAPID STREP SCREEN (MED CTR MEBANE ONLY): Strep Gp A Ag, IA W/Reflex: NEGATIVE

## 2021-10-01 MED ORDER — IBUPROFEN 600 MG PO TABS: 600.0000 mg | ORAL_TABLET | Freq: Three times a day (TID) | ORAL | 0 refills | Status: DC | PRN

## 2021-10-01 MED ORDER — METHOCARBAMOL 500 MG PO TABS: 500.0000 mg | ORAL_TABLET | Freq: Four times a day (QID) | ORAL | 1 refills | Status: DC

## 2021-10-01 MED ORDER — PREDNISONE 10 MG (21) PO TBPK: ORAL_TABLET | ORAL | 0 refills | Status: DC

## 2021-10-01 MED ORDER — FLUTICASONE PROPIONATE 50 MCG/ACT NA SUSP: 2.0000 | Freq: Every day | NASAL | 6 refills | Status: AC

## 2021-10-01 NOTE — Patient Instructions (Signed)
Sore Throat When you have a sore throat, your throat may feel: Tender. Burning. Irritated. Scratchy. Painful when you swallow. Painful when you talk. Many things can cause a sore throat, such as: An infection. Allergies. Dry air. Smoke or pollution. Radiation treatment for cancer. Gastroesophageal reflux disease (GERD). A tumor. A sore throat can be the first sign of another sickness. It can happen with other problems, like: Coughing. Sneezing. Fever. Swelling of the glands in the neck. Most sore throats go away without treatment. Follow these instructions at home:   Medicines Take over-the-counter and prescription medicines only as told by your doctor. Children often get sore throats. Do not give your child aspirin. Use throat sprays to soothe your throat as told by your health care provider. Managing pain To help with pain: Sip warm liquids, such as broth, herbal tea, or warm water. Eat or drink cold or frozen liquids, such as frozen ice pops. Rinse your mouth (gargle) with a salt water mixture 3-4 times a day or as needed. To make salt water, dissolve -1 tsp (3-6 g) of salt in 1 cup (237 mL) of warm water. Do not swallow this mixture. Suck on hard candy or throat lozenges. Put a cool-mist humidifier in your bedroom at night. Sit in the bathroom with the door closed for 5-10 minutes while you run hot water in the shower. General instructions Do not smoke or use any products that contain nicotine or tobacco. If you need help quitting, ask your doctor. Get plenty of rest. Drink enough fluid to keep your pee (urine) pale yellow. Wash your hands often for at least 20 seconds with soap and water. If soap and water are not available, use hand sanitizer. Contact a doctor if: You have a fever for more than 2-3 days. You keep having symptoms for more than 2-3 days. Your throat does not get better in 7 days. You have a fever and your symptoms suddenly get worse. Your child  who is 3 months to 13 years old has a temperature of 102.16F (39C) or higher. Get help right away if: You have trouble breathing. You cannot swallow fluids, soft foods, or your spit. You have swelling in your throat or neck that gets worse. You feel like you may vomit (nauseous) and this feeling lasts a long time. You cannot stop vomiting. These symptoms may be an emergency. Get help right away. Call your local emergency services (911 in the [place] Do not wait to see if the symptoms will go away. Do not drive yourself to the hospital. Summary A sore throat is a painful, burning, irritated, or scratchy throat. Many things can cause a sore throat. Take over-the-counter medicines only as told by your doctor. Get plenty of rest. Drink enough fluid to keep your pee (urine) pale yellow. Contact a doctor if your symptoms get worse or your sore throat does not get better within 7 days. This information is not intended to replace advice given to you by your health care provider. Make sure you discuss any questions you have with your health care provider. Document Revised: 02/27/2021 Document Reviewed: 02/27/2021 Elsevier Patient Education  2022 [employer] Acute Back Pain, Adult Acute back pain is sudden and usually short-lived. It is often caused by an injury to the muscles and tissues in the back. The injury may result from: A muscle, tendon, or ligament getting overstretched or torn. Ligaments are tissues that connect bones to each other. Lifting something improperly can cause a back strain. Wear and  tear (degeneration) of the spinal disks. Spinal disks are circular tissue that provide cushioning between the bones of the spine (vertebrae). Twisting motions, such as while playing sports or doing yard work. A hit to the back. Arthritis. You may have a physical exam, lab tests, and imaging tests to find the cause of your pain. Acute back pain usually goes away with rest and home care. Follow  these instructions at home: Managing pain, stiffness, and swelling Take over-the-counter and prescription medicines only as told by your health care provider. Treatment may include medicines for pain and inflammation that are taken by mouth or applied to the skin, or muscle relaxants. Your health care provider may recommend applying ice during the first 24-48 hours after your pain starts. To do this: Put ice in a plastic bag. Place a towel between your skin and the bag. Leave the ice on for 20 minutes, 2-3 times a day. Remove the ice if your skin turns bright red. This is very important. If you cannot feel pain, heat, or cold, you have a greater risk of damage to the area. If directed, apply heat to the affected area as often as told by your health care provider. Use the heat source that your health care provider recommends, such as a moist heat pack or a heating pad. Place a towel between your skin and the heat source. Leave the heat on for 20-30 minutes. Remove the heat if your skin turns bright red. This is especially important if you are unable to feel pain, heat, or cold. You have a greater risk of getting burned. Activity  Do not stay in bed. Staying in bed for more than 1-2 days can delay your recovery. Sit up and stand up straight. Avoid leaning forward when you sit or hunching over when you stand. If you work at a desk, sit close to it so you do not need to lean over. Keep your chin tucked in. Keep your neck drawn back, and keep your elbows bent at a 90-degree angle (right angle). Sit high and close to the steering wheel when you drive. Add lower back (lumbar) support to your car seat, if needed. Take short walks on even surfaces as soon as you are able. Try to increase the length of time you walk each day. Do not sit, drive, or stand in one place for more than 30 minutes at a time. Sitting or standing for long periods of time can put stress on your back. Do not drive or use heavy  machinery while taking prescription pain medicine. Use proper lifting techniques. When you bend and lift, use positions that put less stress on your back: [place] your knees. Keep the load close to your body. Avoid twisting. Exercise regularly as told by your health care provider. Exercising helps your back heal faster and helps prevent back injuries by keeping muscles strong and flexible. Work with a physical therapist to make a safe exercise program, as recommended by your health care provider. Do any exercises as told by your physical therapist. Lifestyle Maintain a healthy weight. Extra weight puts stress on your back and makes it difficult to have good posture. Avoid activities or situations that make you feel anxious or stressed. Stress and anxiety increase muscle tension and can make back pain worse. Learn ways to manage anxiety and stress, such as through exercise. General instructions Sleep on a firm mattress in a comfortable position. Try lying on your side with your knees slightly bent. If you lie  on your back, put a pillow under your knees. Keep your head and neck in a straight line with your spine (neutral position) when using electronic equipment like smartphones or pads. To do this: Raise your smartphone or pad to look at it instead of bending your head or neck to look down. Put the smartphone or pad at the level of your face while looking at the screen. Follow your treatment plan as told by your health care provider. This may include: Cognitive or behavioral therapy. Acupuncture or massage therapy. Meditation or yoga. Contact a health care provider if: You have pain that is not relieved with rest or medicine. You have increasing pain going down into your legs or buttocks. Your pain does not improve after 2 weeks. You have pain at night. You lose weight without trying. You have a fever or chills. You develop nausea or vomiting. You develop abdominal pain. Get help right away  if: You develop new bowel or bladder control problems. You have unusual weakness or numbness in your arms or legs. You feel faint. These symptoms may represent a serious problem that is an emergency. Do not wait to see if the symptoms will go away. Get medical help right away. Call your local emergency services (911 in the [place] Do not drive yourself to the hospital. Summary Acute back pain is sudden and usually short-lived. Use proper lifting techniques. When you bend and lift, use positions that put less stress on your back. Take over-the-counter and prescription medicines only as told by your health care provider, and apply heat or ice as told. This information is not intended to replace advice given to you by your health care provider. Make sure you discuss any questions you have with your health care provider. Document Revised: 02/22/2021 Document Reviewed: 02/22/2021 Elsevier Patient Education  2022 [employer].

## 2021-10-01 NOTE — Progress Notes (Signed)
Acute Office Visit  Subjective:    Patient ID: Alec Clark, male    DOB: 11/06/1961, 60 y.o.   MRN: [id number]  Chief Complaint  Patient presents with  . Back Pain    Back Pain This is a chronic problem. The current episode started 1 to 4 weeks ago (worse). The quality of the pain is described as aching. The pain radiates to the right foot and right thigh. The pain is at a severity of 8/10. The pain is moderate. The pain is The same all the time. The symptoms are aggravated by position. Pertinent negatives include no bladder incontinence, bowel incontinence or headaches. He has tried NSAIDs and muscle relaxant for the symptoms. The treatment provided mild relief.  Sore Throat  This is a new problem. The current episode started yesterday. The problem has been unchanged. There has been no fever. The patient is experiencing no pain. Associated symptoms include congestion. Pertinent negatives include no coughing or headaches. He has had no exposure to strep. He has tried nothing for the symptoms.    History reviewed. No pertinent past medical history.  History reviewed. No pertinent surgical history.  Family History  Problem Relation Age of Onset  . Hypertension Mother   . Stroke Mother   . Alcohol abuse Brother   . Drug abuse Brother     Social History   Socioeconomic History  . Marital status: Married    Spouse name: Not on file  . Number of children: Not on file  . Years of education: Not on file  . Highest education level: Not on file  Occupational History  . Not on file  Tobacco Use  . Smoking status: Former    Packs/day: 1.00    Types: Cigarettes  . Smokeless tobacco: Never  Vaping Use  . Vaping Use: Never used  Substance and Sexual Activity  . Alcohol use: Yes    Alcohol/week: 2.0 standard drinks    Types: 2 Cans of beer per week  . Drug use: Never  . Sexual activity: Not on file  Other Topics Concern  . Not on file  Social History Narrative  . Not on  file   Social Determinants of Health   Financial Resource Strain: Not on file  Food Insecurity: Not on file  Transportation Needs: Not on file  Physical Activity: Not on file  Stress: Not on file  Social Connections: Not on file  Intimate Partner Violence: Not on file    Outpatient Medications Prior to Visit  Medication Sig Dispense Refill  . rosuvastatin (CRESTOR) 20 MG tablet Take 1 tablet (20 mg total) by mouth daily. 90 tablet 0  . cyclobenzaprine (FLEXERIL) 5 MG tablet Take 1 tablet (5 mg total) by mouth 3 (three) times daily as needed for muscle spasms. (Patient not taking: No sig reported) 30 tablet 1  . ibuprofen (ADVIL) 600 MG tablet Take 1 tablet (600 mg total) by mouth every 8 (eight) hours as needed. 30 tablet 0  . predniSONE (STERAPRED UNI-PAK 21 TAB) 10 MG (21) TBPK tablet 6 tab day 1, 5 day 2, 4 day 3, 3 day 4, 2 day 5, 6 day 1 (Patient not taking: Reported on 05/21/2021) 1 each 0   No facility-administered medications prior to visit.    No Known Allergies  Review of Systems  HENT:  Positive for congestion.   Respiratory:  Negative for cough.   Cardiovascular: Negative.   Gastrointestinal: Negative.  Negative for bowel incontinence.  Genitourinary:  Negative  for bladder incontinence.  Musculoskeletal:  Positive for back pain.  Skin:  Negative for rash.  Neurological:  Negative for headaches.  All other systems reviewed and are negative.     Objective:    Physical Exam Vitals and nursing note reviewed.  Constitutional:      Appearance: Normal appearance.  HENT:     Head: Normocephalic.     Right Ear: External ear normal.     Left Ear: External ear normal.     Mouth/Throat:     Mouth: Mucous membranes are moist.     Pharynx: Oropharynx is clear.  Eyes:     Conjunctiva/sclera: Conjunctivae normal.  Cardiovascular:     Rate and Rhythm: Normal rate and regular rhythm.     Pulses: Normal pulses.     Heart sounds: Normal heart sounds.  Pulmonary:      Effort: Pulmonary effort is normal.     Breath sounds: Normal breath sounds.  Abdominal:     General: Bowel sounds are normal.  Musculoskeletal:     Lumbar back: Tenderness present. Decreased range of motion.  Skin:    General: Skin is warm.     Findings: No rash.  Neurological:     Mental Status: He is alert and oriented to person, place, and time.  Psychiatric:        Behavior: Behavior normal.    BP 125/78   Pulse 63   Temp 97.6 F (36.4 C) (Temporal)   Ht 5' 9"  (1.753 m)   Wt 184 lb (83.5 kg)   SpO2 95%   BMI 27.17 kg/m  Wt Readings from Last 3 Encounters:  10/01/21 184 lb (83.5 kg)  05/21/21 183 lb (83 kg)  05/07/21 190 lb (86.2 kg)    Health Maintenance Due  Topic Date Due  . HIV Screening  Never done  . Hepatitis C Screening  Never done  . TETANUS/TDAP  Never done  . COLONOSCOPY (Pts 45-33yr Insurance coverage will need to be confirmed)  Never done  . Zoster Vaccines- Shingrix (1 of 2) Never done  . COVID-19 Vaccine (4 - Booster for Pfizer series) 01/28/2021  . INFLUENZA VACCINE  Never done    There are no preventive care reminders to display for this patient.   No results found for: TSH Lab Results  Component Value Date   WBC 6.1 03/26/2021   HGB 15.3 03/26/2021   HCT 45.5 03/26/2021   MCV 93 03/26/2021   PLT 228 03/26/2021   Lab Results  Component Value Date   NA 139 03/26/2021   K 4.7 03/26/2021   CO2 22 03/26/2021   GLUCOSE 112 (H) 03/26/2021   BUN 12 03/26/2021   CREATININE 0.97 03/26/2021   BILITOT 1.1 03/26/2021   ALKPHOS 70 03/26/2021   AST 19 03/26/2021   ALT 34 03/26/2021   PROT 6.8 03/26/2021   ALBUMIN 4.6 03/26/2021   CALCIUM 9.3 03/26/2021   EGFR 89 03/26/2021   Lab Results  Component Value Date   CHOL 336 (H) 03/26/2021   Lab Results  Component Value Date   HDL 56 03/26/2021   Lab Results  Component Value Date   LDLCALC 253 (H) 03/26/2021   Lab Results  Component Value Date   TRIG 143 03/26/2021   Lab Results   Component Value Date   CHOLHDL 6.0 (H) 03/26/2021   No results found for: HGBA1C     Assessment & Plan:   Problem List Items Addressed This Visit  Nervous and Auditory   Back pain with sciatica    Symptoms not well controlled, Robaxin, 500 mg tablet by mouth, 600 mg ibuprofen, for pain, prednisone taper, for sore throat, chloraseptic spray, nasonex for congestion.     Education provided with printed hand out given.   RX sent to pharmacy, follow up with worsening unresolved symptoms      Relevant Medications   methocarbamol (ROBAXIN) 500 MG tablet   ibuprofen (ADVIL) 600 MG tablet   predniSONE (STERAPRED UNI-PAK 21 TAB) 10 MG (21) TBPK tablet   Other Relevant Orders   DG Lumbar Spine 2-3 Views     Other   Right shoulder pain   Sore throat - Primary   Relevant Medications   fluticasone (FLONASE) 50 MCG/ACT nasal spray   Other Relevant Orders   Rapid Strep Screen ([facility] ONLY)     Meds ordered this encounter  Medications  . fluticasone (FLONASE) 50 MCG/ACT nasal spray    Sig: Place 2 sprays into both nostrils daily.    Dispense:  16 g    Refill:  6    Order Specific Question:   Supervising Provider    Answer:   Claretta Fraise [phone]  . methocarbamol (ROBAXIN) 500 MG tablet    Sig: Take 1 tablet (500 mg total) by mouth 4 (four) times daily.    Dispense:  60 tablet    Refill:  1    Order Specific Question:   Supervising Provider    Answer:   Claretta Fraise [phone]  . ibuprofen (ADVIL) 600 MG tablet    Sig: Take 1 tablet (600 mg total) by mouth every 8 (eight) hours as needed.    Dispense:  30 tablet    Refill:  0    Order Specific Question:   Supervising Provider    Answer:   Claretta Fraise [phone]  . predniSONE (STERAPRED UNI-PAK 21 TAB) 10 MG (21) TBPK tablet    Sig: 6 tablet day 1, 5 tab day 2, 4 day 3, 3 day 3, 2 day 5, 1 day 6    Dispense:  1 each    Refill:  0    Order Specific Question:   Supervising Provider    AnswerJeneen Rinks     Ivy Lynn, NP

## 2021-10-01 NOTE — Assessment & Plan Note (Signed)
Symptoms not well controlled, Robaxin, 500 mg tablet by mouth, 600 mg ibuprofen, for pain, prednisone taper, for sore throat, chloraseptic spray, nasonex for congestion.     Education provided with printed hand out given.   RX sent to pharmacy, follow up with worsening unresolved symptoms

## 2021-10-03 ENCOUNTER — Other Ambulatory Visit: Payer: Self-pay | Admitting: Nurse Practitioner

## 2021-10-03 DIAGNOSIS — M5430 Sciatica, unspecified side: Secondary | ICD-10-CM

## 2021-10-03 DIAGNOSIS — M549 Dorsalgia, unspecified: Secondary | ICD-10-CM

## 2021-10-08 ENCOUNTER — Telehealth: Payer: Self-pay | Admitting: Nurse Practitioner

## 2021-10-08 NOTE — Telephone Encounter (Signed)
Pt is calling to see if we could get him in to see someone else about his back sooner. He works 2pm-10pm and leaves for work at 1:30pm, asked if we could call before this time whenever we return the call.

## 2021-10-25 ENCOUNTER — Ambulatory Visit: Payer: BC Managed Care – PPO | Admitting: Nurse Practitioner

## 2021-10-25 ENCOUNTER — Encounter: Payer: Self-pay | Admitting: Nurse Practitioner

## 2021-10-25 ENCOUNTER — Other Ambulatory Visit: Payer: Self-pay

## 2021-10-25 VITALS — BP 131/76 | HR 63 | Temp 97.7°F | Resp 20 | Ht 69.0 in | Wt 183.0 lb

## 2021-10-25 DIAGNOSIS — M5430 Sciatica, unspecified side: Secondary | ICD-10-CM

## 2021-10-25 DIAGNOSIS — M549 Dorsalgia, unspecified: Secondary | ICD-10-CM

## 2021-10-25 MED ORDER — METHYLPREDNISOLONE ACETATE 80 MG/ML IJ SUSP
80.0000 mg | Freq: Once | INTRAMUSCULAR | Status: AC
  Administered 2021-10-25: 80 mg via INTRAMUSCULAR

## 2021-10-25 MED ORDER — METHOCARBAMOL 750 MG PO TABS: 750.0000 mg | ORAL_TABLET | Freq: Four times a day (QID) | ORAL | 1 refills | Status: DC

## 2021-10-25 MED ORDER — PREDNISONE 10 MG (21) PO TBPK: ORAL_TABLET | ORAL | 0 refills | Status: DC

## 2021-10-25 NOTE — Progress Notes (Signed)
Acute Office Visit  Subjective:    Patient ID: Alec Clark, male    DOB: 1961/02/06, 60 y.o.   MRN: [id number]  Chief Complaint  Patient presents with   chronic back pain     Back Pain This is a chronic problem. The current episode started more than 1 year ago. The problem has been gradually worsening since onset. The quality of the pain is described as aching. The pain radiates to the right thigh. The pain is at a severity of 8/10. The pain is severe. The pain is The same all the time. The symptoms are aggravated by position. Stiffness is present All day. Associated symptoms include paresthesias and tingling. He has tried muscle relaxant for the symptoms.     Family History  Problem Relation Age of Onset   Hypertension Mother    Stroke Mother    Alcohol abuse Brother    Drug abuse Brother     Social History   Socioeconomic History   Marital status: Married    Spouse name: Not on file   Number of children: Not on file   Years of education: Not on file   Highest education level: Not on file  Occupational History   Not on file  Tobacco Use   Smoking status: Former    Packs/day: 1.00    Types: Cigarettes   Smokeless tobacco: Never  Vaping Use   Vaping Use: Never used  Substance and Sexual Activity   Alcohol use: Yes    Alcohol/week: 2.0 standard drinks    Types: 2 Cans of beer per week   Drug use: Never   Sexual activity: Not on file  Other Topics Concern   Not on file  Social History Narrative   Not on file   Social Determinants of Health   Financial Resource Strain: Not on file  Food Insecurity: Not on file  Transportation Needs: Not on file  Physical Activity: Not on file  Stress: Not on file  Social Connections: Not on file  Intimate Partner Violence: Not on file    Outpatient Medications Prior to Visit  Medication Sig Dispense Refill   fluticasone (FLONASE) 50 MCG/ACT nasal spray Place 2 sprays into both nostrils daily. 16 g 6   ibuprofen  (ADVIL) 600 MG tablet Take 1 tablet (600 mg total) by mouth every 8 (eight) hours as needed. 30 tablet 0   rosuvastatin (CRESTOR) 20 MG tablet Take 1 tablet (20 mg total) by mouth daily. 90 tablet 0   methocarbamol (ROBAXIN) 500 MG tablet Take 1 tablet (500 mg total) by mouth 4 (four) times daily. 60 tablet 1   predniSONE (STERAPRED UNI-PAK 21 TAB) 10 MG (21) TBPK tablet 6 tablet day 1, 5 tab day 2, 4 day 3, 3 day 3, 2 day 5, 1 day 6 1 each 0   No facility-administered medications prior to visit.    No Known Allergies  Review of Systems  Constitutional: Negative.   HENT: Negative.    Eyes: Negative.   Respiratory: Negative.    Cardiovascular: Negative.   Gastrointestinal: Negative.   Musculoskeletal:  Positive for back pain.  Skin: Negative.  Negative for rash.  Neurological:  Positive for tingling and paresthesias.  All other systems reviewed and are negative.     Objective:    Physical Exam Vitals and nursing note reviewed.  Constitutional:      Appearance: Normal appearance.  HENT:     Head: Normocephalic.     Nose: Nose normal.  Mouth/Throat:     Mouth: Mucous membranes are moist.     Pharynx: Oropharynx is clear.  Eyes:     Conjunctiva/sclera: Conjunctivae normal.  Cardiovascular:     Rate and Rhythm: Normal rate and regular rhythm.     Pulses: Normal pulses.     Heart sounds: Normal heart sounds.  Pulmonary:     Effort: Pulmonary effort is normal.     Breath sounds: Normal breath sounds.  Abdominal:     General: Bowel sounds are normal.  Musculoskeletal:     Lumbar back: Tenderness present. Decreased range of motion.  Skin:    General: Skin is warm.     Findings: No rash.  Neurological:     Mental Status: He is alert and oriented to person, place, and time.  Psychiatric:        Behavior: Behavior normal.    BP 131/76   Pulse 63   Temp 97.7 F (36.5 C)   Resp 20   Ht 5' 9"  (1.753 m)   Wt 183 lb (83 kg)   SpO2 96%   BMI 27.02 kg/m  Wt Readings  from Last 3 Encounters:  10/25/21 183 lb (83 kg)  10/01/21 184 lb (83.5 kg)  05/21/21 183 lb (83 kg)    Health Maintenance Due  Topic Date Due   HIV Screening  Never done   Hepatitis C Screening  Never done   TETANUS/TDAP  Never done   COLONOSCOPY (Pts 45-84yr Insurance coverage will need to be confirmed)  Never done   Zoster Vaccines- Shingrix (1 of 2) Never done   COVID-19 Vaccine (4 - Booster for Pfizer series) 11/22/2020   INFLUENZA VACCINE  Never done    There are no preventive care reminders to display for this patient.   No results found for: TSH Lab Results  Component Value Date   WBC 6.1 03/26/2021   HGB 15.3 03/26/2021   HCT 45.5 03/26/2021   MCV 93 03/26/2021   PLT 228 03/26/2021   Lab Results  Component Value Date   NA 139 03/26/2021   K 4.7 03/26/2021   CO2 22 03/26/2021   GLUCOSE 112 (H) 03/26/2021   BUN 12 03/26/2021   CREATININE 0.97 03/26/2021   BILITOT 1.1 03/26/2021   ALKPHOS 70 03/26/2021   AST 19 03/26/2021   ALT 34 03/26/2021   PROT 6.8 03/26/2021   ALBUMIN 4.6 03/26/2021   CALCIUM 9.3 03/26/2021   EGFR 89 03/26/2021   Lab Results  Component Value Date   CHOL 336 (H) 03/26/2021   Lab Results  Component Value Date   HDL 56 03/26/2021   Lab Results  Component Value Date   LDLCALC 253 (H) 03/26/2021   Lab Results  Component Value Date   TRIG 143 03/26/2021   Lab Results  Component Value Date   CHOLHDL 6.0 (H) 03/26/2021   No results found for: HGBA1C     Assessment & Plan:   Problem List Items Addressed This Visit       Nervous and Auditory   Back pain with sciatica - Primary    Back pain with sciatica symptoms not well controlled.  Pain radiating to right thigh and leg with paresthesias.  And tingling.  Patient has an appointment with Ortho but did not take appointment due to being 6 weeks out.  I provided extensive education to patient for the need to make another appointment to see orthopedic.  I did go over patient's  last x-ray and explain degenerative  disease and lower lumbar.  Patient verbalized understanding.  Increased Robaxin from 559m to 750 mg tablet by mouth daily, prednisone taper, 80 mg of Depo-Medrol shot given in clinic.   Printed handouts given.  Follow-up with worsening unresolved symptoms.      Relevant Medications   predniSONE (STERAPRED UNI-PAK 21 TAB) 10 MG (21) TBPK tablet   methocarbamol (ROBAXIN-750) 750 MG tablet   Other Relevant Orders   AMB referral to orthopedics     Meds ordered this encounter  Medications   predniSONE (STERAPRED UNI-PAK 21 TAB) 10 MG (21) TBPK tablet    Sig: 6 tablet day 1, 5 tablet day 2, 4 tablet day 3, 3 tablet day 4, 2 tablet day 5, 1 tablet day 6    Dispense:  1 each    Refill:  0    Order Specific Question:   Supervising Provider    Answer:   SJeneen Rinks  methocarbamol (ROBAXIN-750) 750 MG tablet    Sig: Take 1 tablet (750 mg total) by mouth 4 (four) times daily.    Dispense:  30 tablet    Refill:  1    Order Specific Question:   Supervising Provider    Answer:   SJeneen Rinks    OIvy Lynn NP

## 2021-10-25 NOTE — Addendum Note (Signed)
Addended by: Magdalene River on: 10/25/2021 10:44 AM   Modules accepted: Orders

## 2021-10-25 NOTE — Assessment & Plan Note (Signed)
Back pain with sciatica symptoms not well controlled.  Pain radiating to right thigh and leg with paresthesias.  And tingling.  Patient has an appointment with Ortho but did not take appointment due to being 6 weeks out.  I provided extensive education to patient for the need to make another appointment to see orthopedic.  I did go over patient's last x-ray and explain degenerative disease and lower lumbar.  Patient verbalized understanding.  Increased Robaxin from 500mg  to 750 mg tablet by mouth daily, prednisone taper, 80 mg of Depo-Medrol shot given in clinic.   Printed handouts given.  Follow-up with worsening unresolved symptoms.

## 2021-10-25 NOTE — Patient Instructions (Signed)

## 2021-11-04 ENCOUNTER — Telehealth: Payer: Self-pay | Admitting: Nurse Practitioner

## 2021-11-11 ENCOUNTER — Telehealth: Payer: Self-pay | Admitting: Nurse Practitioner

## 2021-11-11 NOTE — Telephone Encounter (Signed)
Pt checking on referral that was put in on 11/11 but closed out. York Spaniel he has been waiting two weeks and has tried to call three times. Please call back and advise.

## 2021-11-12 NOTE — Telephone Encounter (Signed)
Pt is calling back because he stated that he never got a call. Please call back.

## 2021-11-21 ENCOUNTER — Encounter: Payer: Self-pay | Admitting: Orthopaedic Surgery

## 2021-11-21 ENCOUNTER — Ambulatory Visit (INDEPENDENT_AMBULATORY_CARE_PROVIDER_SITE_OTHER): Payer: BC Managed Care – PPO | Admitting: Orthopaedic Surgery

## 2021-11-21 ENCOUNTER — Other Ambulatory Visit: Payer: Self-pay

## 2021-11-21 DIAGNOSIS — M5430 Sciatica, unspecified side: Secondary | ICD-10-CM

## 2021-11-21 DIAGNOSIS — M549 Dorsalgia, unspecified: Secondary | ICD-10-CM | POA: Diagnosis not present

## 2021-11-21 MED ORDER — IBUPROFEN 600 MG PO TABS: 600.0000 mg | ORAL_TABLET | Freq: Three times a day (TID) | ORAL | 0 refills | Status: DC | PRN

## 2021-11-21 NOTE — Progress Notes (Signed)
Office Visit Note   Patient: Alec Clark           Date of Birth: June 17, 1961           MRN: [id number] Visit Date: 11/21/2021              Requested by: Daryll Drown, NP [address] [place],  [place] [address] PCP: Daryll Drown, NP   Assessment & Plan: Visit Diagnoses:  1. Back pain with sciatica     Plan: Patient has back pain with right S1 weakness.  We will proceed with an MRI scan, lumbar.  Office follow-up after scan for review.  Follow-Up Instructions: No follow-ups on file.   Orders:  No orders of the defined types were placed in this encounter.  Meds ordered this encounter  Medications   ibuprofen (ADVIL) 600 MG tablet    Sig: Take 1 tablet (600 mg total) by mouth every 8 (eight) hours as needed.    Dispense:  30 tablet    Refill:  0      Procedures: No procedures performed   Clinical Data: No additional findings.   Subjective: Chief Complaint  Patient presents with   Lower Back - Pain    HPI 60 year old male seen with 20-month history of back pain that radiates into his right leg down to his ankle.  She had some numbness and tingling bottom portion of his foot he describes it as pins-and-needles.  He took prednisone Dosepak states it helped as long as he was taking it.  He is not able to stand long.  Ibuprofen and Robaxin gave him less relief.  He has noticed some weakness in his calf on the right leg.  None on the left.  Review of Systems negative for chills or fever no associated bowel or bladder symptoms all other 14 point systems noncontributory to HPI.   Objective: Vital Signs: Ht 5\' 9"  (1.753 m)   Wt 183 lb (83 kg)   BMI 27.02 kg/m   Physical Exam Constitutional:      Appearance: He is well-developed.  HENT:     Head: Normocephalic and atraumatic.     Right Ear: External ear normal.     Left Ear: External ear normal.  Eyes:     Pupils: Pupils are equal, round, and reactive to light.  Neck:     Thyroid: No thyromegaly.      Trachea: No tracheal deviation.  Cardiovascular:     Rate and Rhythm: Normal rate.  Pulmonary:     Effort: Pulmonary effort is normal.     Breath sounds: No wheezing.  Abdominal:     General: Bowel sounds are normal.     Palpations: Abdomen is soft.  Musculoskeletal:     Cervical back: Neck supple.  Skin:    General: Skin is warm and dry.     Capillary Refill: Capillary refill takes less than 2 seconds.  Neurological:     Mental Status: He is alert and oriented to person, place, and time.  Psychiatric:        Behavior: Behavior normal.        Thought Content: Thought content normal.        Judgment: Judgment normal.    Ortho Exam patient has some weakness fatigues out with the single stance toe raises.  He can take few steps on his toes also able to heel walk.  Anterior tib is strong right and left.  Positive straight leg raising on the  right 80 degrees.  Positive up to compression test negative straight leg raising on the left.  Pedal pulses are palpable and symmetrical.  Good quad strength.  Specialty Comments:  No specialty comments available.  Imaging: No results found.   PMFS History: Patient Active Problem List   Diagnosis Date Noted   Sore throat 10/01/2021   Back pain with sciatica 10/01/2021   Pain of right thumb 05/07/2021   Annual physical exam 03/26/2021   Right shoulder pain 03/26/2021   Rash 03/26/2021   Armpit abscess 03/26/2021   No past medical history on file.  Family History  Problem Relation Age of Onset   Hypertension Mother    Stroke Mother    Alcohol abuse Brother    Drug abuse Brother     No past surgical history on file. Social History   Occupational History   Not on file  Tobacco Use   Smoking status: Former    Packs/day: 1.00    Types: Cigarettes   Smokeless tobacco: Never  Vaping Use   Vaping Use: Never used  Substance and Sexual Activity   Alcohol use: Yes    Alcohol/week: 2.0 standard drinks    Types: 2 Cans of beer  per week   Drug use: Never   Sexual activity: Not on file

## 2021-12-03 ENCOUNTER — Ambulatory Visit (HOSPITAL_COMMUNITY): Payer: BC Managed Care – PPO

## 2021-12-03 ENCOUNTER — Encounter (HOSPITAL_COMMUNITY): Payer: Self-pay

## 2022-01-28 ENCOUNTER — Ambulatory Visit: Payer: BC Managed Care – PPO | Admitting: Nurse Practitioner

## 2022-01-28 ENCOUNTER — Encounter: Payer: Self-pay | Admitting: Nurse Practitioner

## 2022-01-28 VITALS — BP 133/82 | HR 71 | Temp 98.9°F | Ht 69.0 in | Wt 183.0 lb

## 2022-01-28 DIAGNOSIS — H9202 Otalgia, left ear: Secondary | ICD-10-CM | POA: Diagnosis not present

## 2022-01-28 DIAGNOSIS — R058 Other specified cough: Secondary | ICD-10-CM | POA: Diagnosis not present

## 2022-01-28 DIAGNOSIS — J069 Acute upper respiratory infection, unspecified: Secondary | ICD-10-CM | POA: Insufficient documentation

## 2022-01-28 MED ORDER — PSEUDOEPH-BROMPHEN-DM 30-2-10 MG/5ML PO SYRP: 5.0000 mL | ORAL_SOLUTION | Freq: Four times a day (QID) | ORAL | 0 refills | Status: DC | PRN

## 2022-01-28 MED ORDER — AMOXICILLIN-POT CLAVULANATE 875-125 MG PO TABS: 1.0000 | ORAL_TABLET | Freq: Two times a day (BID) | ORAL | 0 refills | Status: DC

## 2022-01-28 NOTE — Patient Instructions (Signed)

## 2022-01-28 NOTE — Progress Notes (Signed)
Acute Office Visit  Subjective:    Patient ID: Alec Clark, male    DOB: [birth year], 61 y.o.   MRN: [id number]  Chief Complaint  Patient presents with   cough, congestion    URI  This is a new problem. Episode onset: sunday. The problem has been unchanged. There has been no fever. Associated symptoms include congestion, coughing, ear pain and headaches. Pertinent negatives include no nausea or rash. He has tried acetaminophen for the symptoms. The treatment provided mild relief.  Otalgia  There is pain in the left ear. This is a new problem. The current episode started yesterday. The problem occurs constantly. The problem has been unchanged. There has been no fever. The pain is mild. Associated symptoms include coughing and headaches. Pertinent negatives include no rash.  Cough This is a new problem. The current episode started yesterday. The problem has been gradually worsening. The problem occurs constantly. The cough is Productive of sputum. Associated symptoms include chills, ear pain, headaches and nasal congestion. Pertinent negatives include no ear congestion, fever, rash or sweats. Nothing aggravates the symptoms. He has tried nothing for the symptoms.    History reviewed. No pertinent past medical history.  History reviewed. No pertinent surgical history.  Family History  Problem Relation Age of Onset   Hypertension Mother    Stroke Mother    Alcohol abuse Brother    Drug abuse Brother     Social History   Socioeconomic History   Marital status: Married    Spouse name: Not on file   Number of children: Not on file   Years of education: Not on file   Highest education level: Not on file  Occupational History   Not on file  Tobacco Use   Smoking status: Former    Packs/day: 1.00    Types: Cigarettes   Smokeless tobacco: Never  Vaping Use   Vaping Use: Never used  Substance and Sexual Activity   Alcohol use: Yes    Alcohol/week: 2.0 standard drinks     Types: 2 Cans of beer per week   Drug use: Never   Sexual activity: Not on file  Other Topics Concern   Not on file  Social History Narrative   Not on file   Social Determinants of Health   Financial Resource Strain: Not on file  Food Insecurity: Not on file  Transportation Needs: Not on file  Physical Activity: Not on file  Stress: Not on file  Social Connections: Not on file  Intimate Partner Violence: Not on file    Outpatient Medications Prior to Visit  Medication Sig Dispense Refill   fluticasone (FLONASE) 50 MCG/ACT nasal spray Place 2 sprays into both nostrils daily. 16 g 6   ibuprofen (ADVIL) 600 MG tablet Take 1 tablet (600 mg total) by mouth every 8 (eight) hours as needed. 30 tablet 0   methocarbamol (ROBAXIN-750) 750 MG tablet Take 1 tablet (750 mg total) by mouth 4 (four) times daily. 30 tablet 1   predniSONE (STERAPRED UNI-PAK 21 TAB) 10 MG (21) TBPK tablet 6 tablet day 1, 5 tablet day 2, 4 tablet day 3, 3 tablet day 4, 2 tablet day 5, 1 tablet day 6 1 each 0   rosuvastatin (CRESTOR) 20 MG tablet Take 1 tablet (20 mg total) by mouth daily. 90 tablet 0   No facility-administered medications prior to visit.    No Known Allergies  Review of Systems  Constitutional:  Positive for chills. Negative for fever.  HENT:  Positive for congestion and ear pain.   Eyes:  Positive for discharge.  Respiratory:  Positive for cough.   Gastrointestinal:  Negative for nausea.  Musculoskeletal: Negative.   Skin:  Negative for rash.  Neurological:  Positive for headaches.  All other systems reviewed and are negative.     Objective:    Physical Exam Vitals and nursing note reviewed.  Constitutional:      Appearance: Normal appearance.  HENT:     Head: Normocephalic.     Right Ear: Ear canal and external ear normal.     Left Ear: Ear canal and external ear normal. Tenderness present. There is no impacted cerumen. No foreign body.     Nose: Congestion present.      Mouth/Throat:     Mouth: Mucous membranes are moist.     Pharynx: Oropharynx is clear.  Eyes:     Conjunctiva/sclera: Conjunctivae normal.  Cardiovascular:     Rate and Rhythm: Normal rate and regular rhythm.  Pulmonary:     Effort: Pulmonary effort is normal.     Breath sounds: Normal breath sounds.  Abdominal:     General: Bowel sounds are normal.  Musculoskeletal:        General: Normal range of motion.  Skin:    General: Skin is warm.     Findings: No rash.  Neurological:     General: No focal deficit present.     Mental Status: He is alert and oriented to person, place, and time. Mental status is at baseline.  Psychiatric:        Mood and Affect: Mood normal.    BP 133/82    Pulse 71    Temp 98.9 F (37.2 C)    Ht _0  (1.753 m)    Wt 183 lb (83 kg)    SpO2 95%    BMI 27.02 kg/m  Wt Readings from Last 3 Encounters:  01/28/22 183 lb (83 kg)  11/21/21 183 lb (83 kg)  10/25/21 183 lb (83 kg)    Health Maintenance Due  Topic Date Due   HIV Screening  Never done   Hepatitis C Screening  Never done   TETANUS/TDAP  Never done   COLONOSCOPY (Pts 45-77yr Insurance coverage will need to be confirmed)  Never done   Zoster Vaccines- Shingrix (1 of 2) Never done   COVID-19 Vaccine (4 - Booster for Pfizer series) 11/22/2020   INFLUENZA VACCINE  Never done    There are no preventive care reminders to display for this patient.   No results found for: TSH Lab Results  Component Value Date   WBC 6.1 03/26/2021   HGB 15.3 03/26/2021   HCT 45.5 03/26/2021   MCV 93 03/26/2021   PLT 228 03/26/2021   Lab Results  Component Value Date   NA 139 03/26/2021   K 4.7 03/26/2021   CO2 22 03/26/2021   GLUCOSE 112 (H) 03/26/2021   BUN 12 03/26/2021   CREATININE 0.97 03/26/2021   BILITOT 1.1 03/26/2021   ALKPHOS 70 03/26/2021   AST 19 03/26/2021   ALT 34 03/26/2021   PROT 6.8 03/26/2021   ALBUMIN 4.6 03/26/2021   CALCIUM 9.3 03/26/2021   EGFR 89 03/26/2021   Lab Results   Component Value Date   CHOL 336 (H) 03/26/2021   Lab Results  Component Value Date   HDL 56 03/26/2021   Lab Results  Component Value Date   LDLCALC 253 (H) 03/26/2021   Lab Results  Component Value Date   TRIG 143 03/26/2021   Lab Results  Component Value Date   CHOLHDL 6.0 (H) 03/26/2021   No results found for: HGBA1C     Assessment & Plan:  Take meds as prescribed - Use a cool mist humidifier  -Use saline nose sprays frequently -Force fluids -For fever or aches or pains- take Tylenol or ibuprofen. -At home COVID-19 test negative. -Bromfed for cough, congestion and cold symptoms -If symptoms do not improve, she may need to be COVID tested to rule this out Follow up with worsening unresolved symptoms  Problem List Items Addressed This Visit       Respiratory   Upper respiratory tract infection - Primary   Relevant Medications   amoxicillin-clavulanate (AUGMENTIN) 875-125 MG tablet   brompheniramine-pseudoephedrine-DM 30-2-10 MG/5ML syrup   Other Visit Diagnoses     Left ear pain       Other cough       Relevant Medications   brompheniramine-pseudoephedrine-DM 30-2-10 MG/5ML syrup        Meds ordered this encounter  Medications   amoxicillin-clavulanate (AUGMENTIN) 875-125 MG tablet    Sig: Take 1 tablet by mouth 2 (two) times daily.    Dispense:  14 tablet    Refill:  0    Order Specific Question:   Supervising Provider    Answer:   Claretta Fraise [phone]   brompheniramine-pseudoephedrine-DM 30-2-10 MG/5ML syrup    Sig: Take 5 mLs by mouth 4 (four) times daily as needed.    Dispense:  120 mL    Refill:  0    Order Specific Question:   Supervising Provider    Answer:   Claretta Fraise [[id number]]     Ivy Lynn, NP

## 2022-05-16 ENCOUNTER — Ambulatory Visit: Payer: BC Managed Care – PPO | Admitting: Family Medicine

## 2022-05-16 ENCOUNTER — Encounter: Payer: Self-pay | Admitting: Family Medicine

## 2022-05-16 VITALS — BP 131/80 | HR 64 | Temp 97.7°F | Ht 69.0 in | Wt 180.4 lb

## 2022-05-16 DIAGNOSIS — M5440 Lumbago with sciatica, unspecified side: Secondary | ICD-10-CM | POA: Diagnosis not present

## 2022-05-16 DIAGNOSIS — G8929 Other chronic pain: Secondary | ICD-10-CM

## 2022-05-16 DIAGNOSIS — J301 Allergic rhinitis due to pollen: Secondary | ICD-10-CM | POA: Diagnosis not present

## 2022-05-16 DIAGNOSIS — J0140 Acute pansinusitis, unspecified: Secondary | ICD-10-CM

## 2022-05-16 MED ORDER — METHOCARBAMOL 750 MG PO TABS: 750.0000 mg | ORAL_TABLET | Freq: Four times a day (QID) | ORAL | 1 refills | Status: DC

## 2022-05-16 MED ORDER — AMOXICILLIN-POT CLAVULANATE 875-125 MG PO TABS: 1.0000 | ORAL_TABLET | Freq: Two times a day (BID) | ORAL | 0 refills | Status: AC

## 2022-05-16 NOTE — Progress Notes (Signed)
Acute Office Visit  Subjective:     Patient ID: Alec Clark, male    DOB: [birth year], 61 y.o.   MRN: [id number]  Chief Complaint  Patient presents with   Sinus Problem    Sinus Problem  Patient is in today for sinusitis. Reports a history of chronic allergic rhinitis due to pollen. He has been taking zyrtec and flonase. For the last 3 days he has had increased sinus pressure and tenderness around his eyes and cheeks. He has taken advil and OTC sinus congestion medication without improvement. He has also had postnasal drip, nasal congestion, and intermittent sore throat. Denies cough, fever, chills, or body aches.   He would also like a refill on robaxin for his chronic back pain. He reports symptoms have been stable. He sometimes has sciatica down the right side. He has been ortho for this. They planned for an MRI but his insurance will not cover it. Denies fever, chills, changes in gait, changes in bowel or bladder control, or saddle anesthesia.   ROS As per HPI.      Objective:    BP 131/80   Pulse 64   Temp 97.7 F (36.5 C) (Temporal)   Ht 5\' 9"  (1.753 m)   Wt 180 lb 6 oz (81.8 kg)   SpO2 96%   BMI 26.64 kg/m    Physical Exam Vitals and nursing note reviewed.  Constitutional:      General: He is not in acute distress.    Appearance: He is not ill-appearing, toxic-appearing or diaphoretic.  HENT:     Head: Normocephalic and atraumatic.     Right Ear: Ear canal and external ear normal. A middle ear effusion is present. Tympanic membrane is not perforated, erythematous, retracted or bulging.     Left Ear: Ear canal and external ear normal. A middle ear effusion is present. Tympanic membrane is not perforated, erythematous, retracted or bulging.     Nose: Congestion present.     Right Sinus: Maxillary sinus tenderness and frontal sinus tenderness present.     Left Sinus: Maxillary sinus tenderness and frontal sinus tenderness present.     Mouth/Throat:      Pharynx: No pharyngeal swelling, oropharyngeal exudate or posterior oropharyngeal erythema.     Tonsils: No tonsillar exudate. 0 on the right. 0 on the left.  Eyes:     General:        Right eye: No discharge.        Left eye: No discharge.     Conjunctiva/sclera: Conjunctivae normal.  Cardiovascular:     Rate and Rhythm: Normal rate and regular rhythm.     Heart sounds: Normal heart sounds. No murmur heard. Pulmonary:     Effort: Pulmonary effort is normal. No respiratory distress.     Breath sounds: Normal breath sounds.  Musculoskeletal:     Cervical back: No tenderness.     Right lower leg: No edema.     Left lower leg: No edema.  Lymphadenopathy:     Cervical: No cervical adenopathy.  Skin:    General: Skin is warm and dry.  Neurological:     General: No focal deficit present.     Mental Status: He is alert and oriented to person, place, and time.     Gait: Gait normal.  Psychiatric:        Mood and Affect: Mood normal.        Behavior: Behavior normal.    No results found for any visits  on 05/16/22.      Assessment & Plan:   Abdullahi was seen today for sinus problem.  Diagnoses and all orders for this visit:  Acute non-recurrent pansinusitis Augmentin as below. Continue OTC sinus medication and advil.  -     amoxicillin-clavulanate (AUGMENTIN) 875-125 MG tablet; Take 1 tablet by mouth 2 (two) times daily for 7 days.  Chronic allergic rhinitis due to pollen Continue zyrtec and flonase.   Chronic low back pain with sciatica, sciatica laterality unspecified, unspecified back pain laterality Robaxin refilled as below. Continue following up with ortho.  -     methocarbamol (ROBAXIN-750) 750 MG tablet; Take 1 tablet (750 mg total) by mouth 4 (four) times daily.  Return for schedule CPE with PCP.  The patient indicates understanding of these issues and agrees with the plan.  Gwenlyn Perking, FNP

## 2022-05-16 NOTE — Patient Instructions (Signed)

## 2022-10-06 ENCOUNTER — Ambulatory Visit: Payer: BC Managed Care – PPO | Admitting: Nurse Practitioner

## 2022-10-06 ENCOUNTER — Encounter: Payer: Self-pay | Admitting: Nurse Practitioner

## 2022-10-06 VITALS — BP 127/85 | HR 76 | Temp 98.9°F | Ht 69.0 in | Wt 180.0 lb

## 2022-10-06 DIAGNOSIS — J0111 Acute recurrent frontal sinusitis: Secondary | ICD-10-CM | POA: Diagnosis not present

## 2022-10-06 MED ORDER — GUAIFENESIN ER 600 MG PO TB12: 600.0000 mg | ORAL_TABLET | Freq: Two times a day (BID) | ORAL | 0 refills | Status: DC

## 2022-10-06 MED ORDER — BENZONATATE 100 MG PO CAPS: 100.0000 mg | ORAL_CAPSULE | Freq: Three times a day (TID) | ORAL | 0 refills | Status: DC | PRN

## 2022-10-06 MED ORDER — AZITHROMYCIN 250 MG PO TABS: ORAL_TABLET | ORAL | 0 refills | Status: AC

## 2022-10-06 MED ORDER — ACETAMINOPHEN 500 MG PO TABS: 500.0000 mg | ORAL_TABLET | Freq: Four times a day (QID) | ORAL | 0 refills | Status: AC | PRN

## 2022-10-06 NOTE — Patient Instructions (Signed)

## 2022-10-06 NOTE — Progress Notes (Signed)
Acute Office Visit  Subjective:     Patient ID: Alec Clark, male    DOB: [birth year], 61 y.o.   MRN: [id number]  Chief Complaint  Patient presents with   Sinus Problem   Headache    Pt states this has been going on for about 1 week    Ear Pain    Left ear   Sore Throat    Couple of days ago    Sinusitis This is a new problem. The current episode started in the past 7 days. The problem is unchanged. There has been no fever. The pain is moderate. Associated symptoms include chills, congestion, coughing and sinus pressure. Pertinent negatives include no shortness of breath, sore throat or swollen glands. Past treatments include oral decongestants and nasal decongestants. The treatment provided no relief.     Review of Systems  Constitutional:  Positive for chills.  HENT:  Positive for congestion and sinus pressure. Negative for sore throat.   Respiratory:  Positive for cough. Negative for shortness of breath.   All other systems reviewed and are negative.       Objective:    BP 127/85   Pulse 76   Temp 98.9 F (37.2 C)   Ht 5\' 9"  (1.753 m)   Wt 180 lb (81.6 kg)   SpO2 98%   BMI 26.58 kg/m  BP Readings from Last 3 Encounters:  10/06/22 127/85  05/16/22 131/80  01/28/22 133/82   Wt Readings from Last 3 Encounters:  10/06/22 180 lb (81.6 kg)  05/16/22 180 lb 6 oz (81.8 kg)  01/28/22 183 lb (83 kg)      Physical Exam Vitals and nursing note reviewed.  Constitutional:      Appearance: He is well-developed.  HENT:     Head: Normocephalic.     Right Ear: External ear normal.     Left Ear: External ear normal.     Nose: Congestion present.  Eyes:     Conjunctiva/sclera: Conjunctivae normal.     Pupils: Pupils are equal, round, and reactive to light.  Cardiovascular:     Rate and Rhythm: Normal rate and regular rhythm.     Heart sounds: Normal heart sounds.  Pulmonary:     Effort: Pulmonary effort is normal.  Abdominal:     General: Bowel sounds  are normal.     Palpations: Abdomen is soft.  Musculoskeletal:        General: Normal range of motion.  Skin:    General: Skin is warm.  Neurological:     Mental Status: He is alert and oriented to person, place, and time.     No results found for any visits on 10/06/22.      Assessment & Plan:  Patient presents with symptoms of sinus pressure, headache, cough and congestion in the past 5 to 7 days.  Nothing Over-the-counter has helped.  Advised patient to Take meds as prescribed - Use a cool mist humidifier  -Use saline nose sprays frequently -Force fluids -For fever or aches or pains- take Tylenol or ibuprofen. -If symptoms do not improve, she may need to be COVID tested to rule this out Follow up with worsening unresolved symptoms  Problem List Items Addressed This Visit   None Visit Diagnoses     Acute recurrent frontal sinusitis    -  Primary   Relevant Medications   azithromycin (ZITHROMAX) 250 MG tablet   guaiFENesin (MUCINEX) 600 MG 12 hr tablet   benzonatate (TESSALON PERLES) 100  MG capsule   acetaminophen (TYLENOL) 500 MG tablet       Meds ordered this encounter  Medications   azithromycin (ZITHROMAX) 250 MG tablet    Sig: Take 2 tablets on day 1, then 1 tablet daily on days 2 through 5    Dispense:  6 tablet    Refill:  0    Order Specific Question:   Supervising Provider    Answer:   Mechele Claude [[id number]]   guaiFENesin (MUCINEX) 600 MG 12 hr tablet    Sig: Take 1 tablet (600 mg total) by mouth 2 (two) times daily.    Dispense:  30 tablet    Refill:  0    Order Specific Question:   Supervising Provider    Answer:   Standley Brooking   benzonatate (TESSALON PERLES) 100 MG capsule    Sig: Take 1 capsule (100 mg total) by mouth 3 (three) times daily as needed.    Dispense:  20 capsule    Refill:  0    Order Specific Question:   Supervising Provider    Answer:   Standley Brooking   acetaminophen (TYLENOL) 500 MG tablet    Sig: Take 1  tablet (500 mg total) by mouth every 6 (six) hours as needed.    Dispense:  30 tablet    Refill:  0    Order Specific Question:   Supervising Provider    Answer:   Mechele Claude [phone]    Return if symptoms worsen or fail to improve.  Daryll Drown, NP

## 2022-11-28 ENCOUNTER — Ambulatory Visit: Payer: BC Managed Care – PPO

## 2023-01-30 ENCOUNTER — Encounter: Payer: Self-pay | Admitting: Nurse Practitioner

## 2023-01-30 ENCOUNTER — Ambulatory Visit: Payer: BC Managed Care – PPO | Admitting: Nurse Practitioner

## 2023-01-30 VITALS — BP 130/82 | HR 86 | Temp 97.8°F | Resp 20 | Ht 69.0 in | Wt 185.0 lb

## 2023-01-30 DIAGNOSIS — R051 Acute cough: Secondary | ICD-10-CM | POA: Diagnosis not present

## 2023-01-30 DIAGNOSIS — J0100 Acute maxillary sinusitis, unspecified: Secondary | ICD-10-CM

## 2023-01-30 MED ORDER — AMOXICILLIN-POT CLAVULANATE 875-125 MG PO TABS: 1.0000 | ORAL_TABLET | Freq: Two times a day (BID) | ORAL | 0 refills | Status: DC

## 2023-01-30 NOTE — Progress Notes (Signed)
Subjective:    Patient ID: Alec Clark, male    DOB: [birth year], 61 y.o.   MRN: [id number]   Chief Complaint: Facial Pain and Cough   Sinusitis This is a new problem. The current episode started in the past 7 days. The problem has been gradually worsening since onset. There has been no fever. His pain is at a severity of 6/10. The pain is moderate. Associated symptoms include congestion, coughing and sinus pressure. Pertinent negatives include no chills, headaches, shortness of breath or sore throat. Treatments tried: flonase and zyrtec. The treatment provided mild relief.       Review of Systems  Constitutional:  Positive for fatigue. Negative for chills and fever.  HENT:  Positive for congestion and sinus pressure. Negative for sore throat.   Respiratory:  Positive for cough. Negative for shortness of breath and wheezing.   Neurological:  Negative for headaches.       Objective:   Physical Exam Vitals and nursing note reviewed.  Constitutional:      Appearance: Normal appearance. He is well-developed.  HENT:     Head: Normocephalic.     Right Ear: Tympanic membrane normal.     Left Ear: Tympanic membrane normal.     Nose: Congestion and rhinorrhea present.     Mouth/Throat:     Mouth: Mucous membranes are moist.     Pharynx: Oropharynx is clear.  Eyes:     Pupils: Pupils are equal, round, and reactive to light.  Neck:     Thyroid: No thyroid mass or thyromegaly.     Vascular: No carotid bruit or JVD.     Trachea: Phonation normal.  Cardiovascular:     Rate and Rhythm: Normal rate and regular rhythm.  Pulmonary:     Effort: Pulmonary effort is normal. No respiratory distress.     Breath sounds: Normal breath sounds.     Comments: Slight cough noted Abdominal:     General: Bowel sounds are normal.     Palpations: Abdomen is soft.     Tenderness: There is no abdominal tenderness.  Musculoskeletal:        General: Normal range of motion.     Cervical back:  Normal range of motion and neck supple.  Lymphadenopathy:     Cervical: No cervical adenopathy.  Skin:    General: Skin is warm and dry.  Neurological:     Mental Status: He is alert and oriented to person, place, and time.  Psychiatric:        Behavior: Behavior normal.        Thought Content: Thought content normal.        Judgment: Judgment normal.    BP 130/82   Pulse 86   Temp 97.8 F (36.6 C) (Temporal)   Resp 20   Ht 5' 9"$  (1.753 m)   Wt 185 lb (83.9 kg)   SpO2 96%   BMI 27.32 kg/m         Assessment & Plan:  Alec Clark in today with chief complaint of Facial Pain and Cough   1. Acute non-recurrent maxillary sinusitis 2. Acute cough 1. Take meds as prescribed 2. Use a cool mist humidifier especially during the winter months and when heat has been humid. 3. Use saline nose sprays frequently 4. Saline irrigations of the nose can be very helpful if done frequently.  * 4X daily for 1 week*  * Use of a nettie pot can be helpful with this. Follow directions with  this* 5. Drink plenty of fluids 6. Keep thermostat turn down low 7.For any cough or congestion- mucinex OTC 8. For fever or aces or pains- take tylenol or ibuprofen appropriate for age and weight.  * for fevers greater than 101 orally you may alternate ibuprofen and tylenol every  3 hours.    Meds ordered this encounter  Medications   amoxicillin-clavulanate (AUGMENTIN) 875-125 MG tablet    Sig: Take 1 tablet by mouth 2 (two) times daily.    Dispense:  14 tablet    Refill:  0    Order Specific Question:   Supervising Provider    Answer:   Caryl Pina A [id number]      The above assessment and management plan was discussed with the patient. The patient verbalized understanding of and has agreed to the management plan. Patient is aware to call the clinic if symptoms persist or worsen. Patient is aware when to return to the clinic for a follow-up visit. Patient educated on when it is  appropriate to go to the emergency department.   Mary-Margaret Hassell Done, FNP

## 2023-01-30 NOTE — Patient Instructions (Signed)

## 2023-02-12 ENCOUNTER — Encounter: Payer: Self-pay | Admitting: Radiology

## 2023-02-13 IMAGING — DX DG LUMBAR SPINE 2-3V
2 series · 2 of 2 positions shown · non-contrast
Comparison: None.

CLINICAL DATA: Low back pain

EXAM:
LUMBAR SPINE - 2-3 VIEW

[l-spine ap]
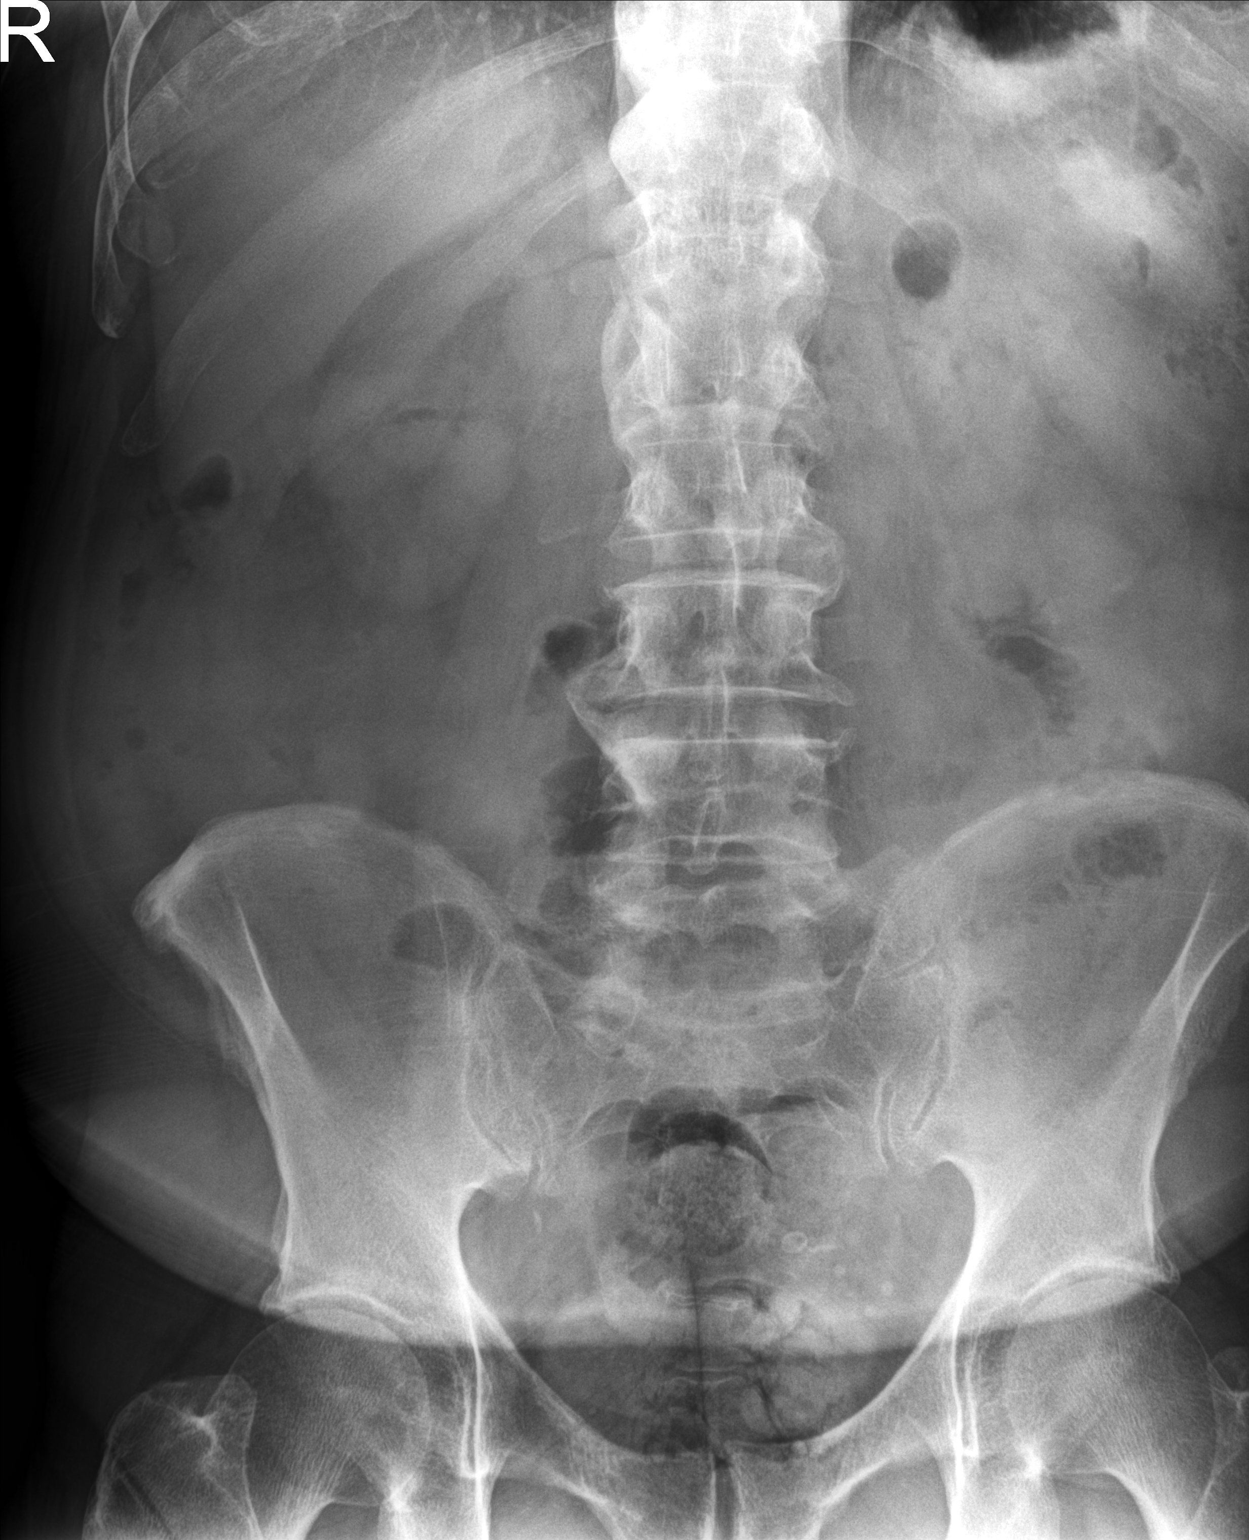

[l-spine lat]
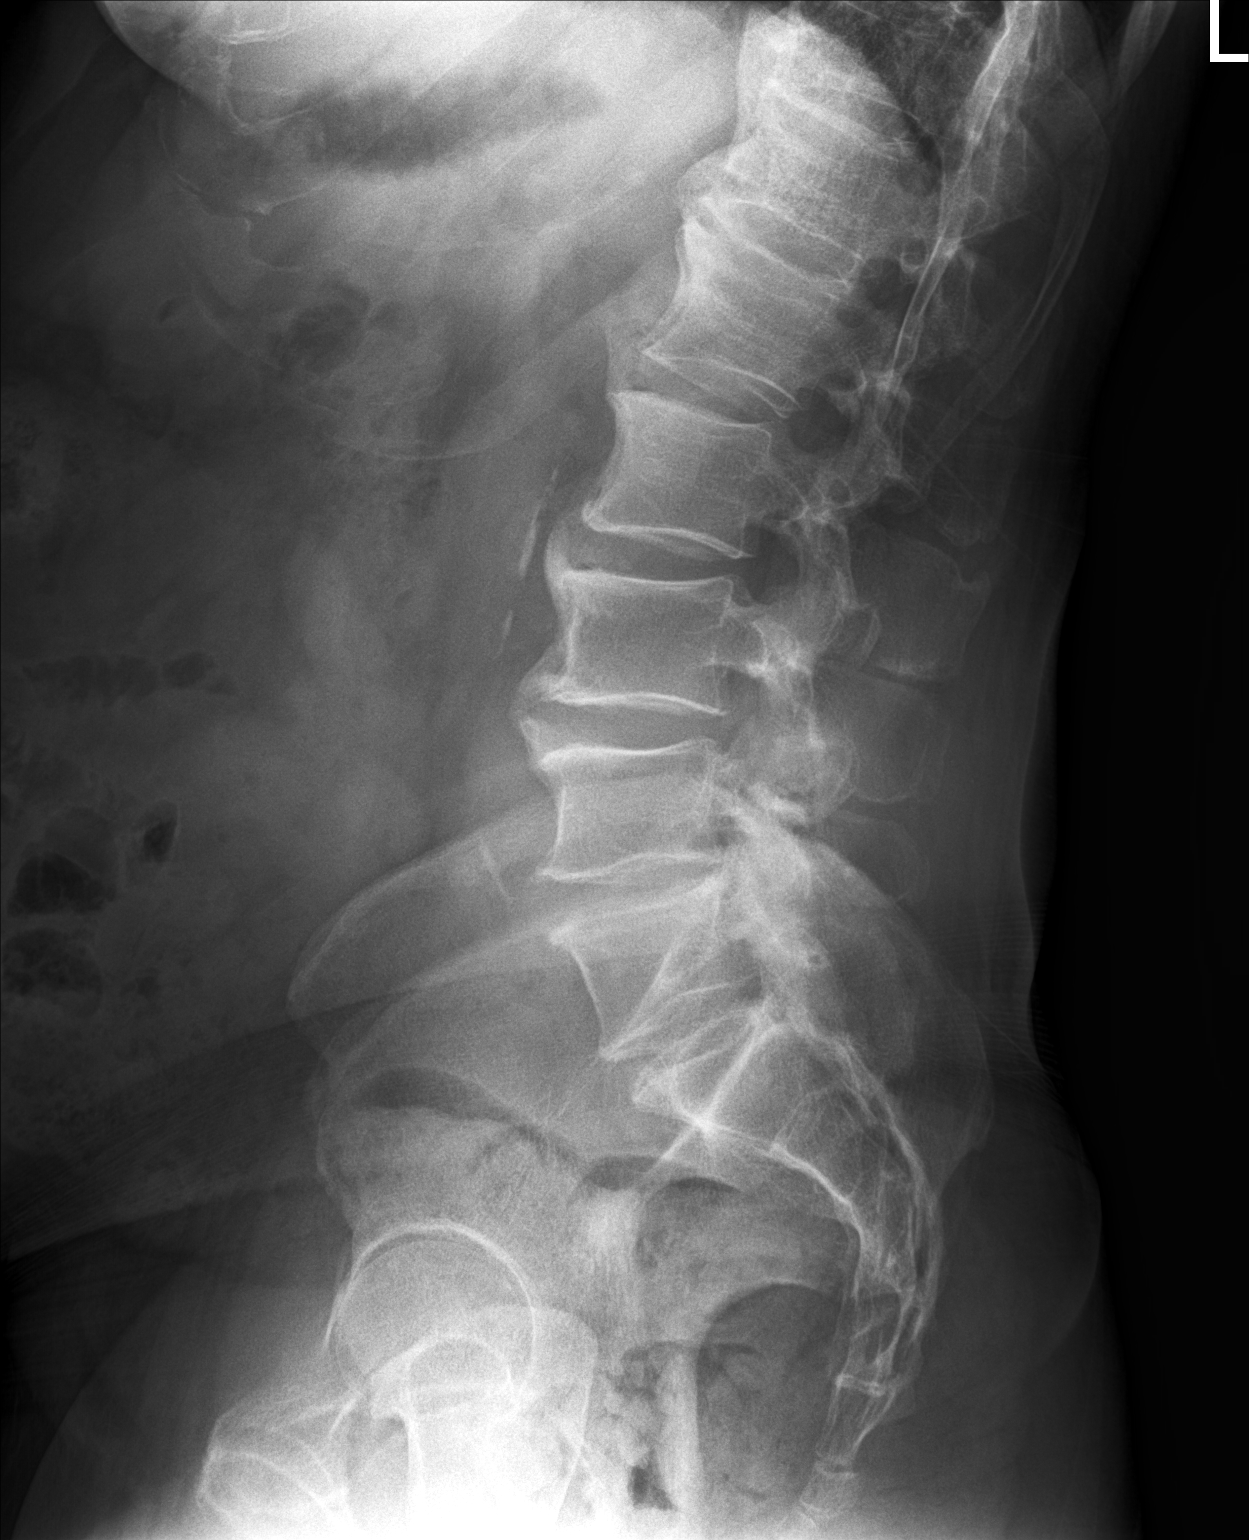

[2 of 2 positions shown; findings below may reference images not displayed]

FINDINGS: Suspected transitional anatomy with 5 non rib-bearing lumbar type
vertebra. Lumbar alignment within normal limits. Diffuse
degenerative changes throughout the lumbar spine with mild disc
space narrowing and moderate osteophytes. Facet degenerative changes
of the lower lumbar spine. Vertebral body heights are maintained
IMPRESSION: Suspected transitional anatomy. Mild diffuse degenerative changes
throughout the lumbar spine

## 2023-02-19 ENCOUNTER — Ambulatory Visit (INDEPENDENT_AMBULATORY_CARE_PROVIDER_SITE_OTHER): Payer: BC Managed Care – PPO

## 2023-02-19 ENCOUNTER — Encounter: Payer: Self-pay | Admitting: Family

## 2023-02-19 ENCOUNTER — Ambulatory Visit: Payer: BC Managed Care – PPO | Admitting: Family

## 2023-02-19 VITALS — BP 135/83 | HR 71 | Temp 97.7°F | Ht 69.0 in | Wt 186.0 lb

## 2023-02-19 DIAGNOSIS — J209 Acute bronchitis, unspecified: Secondary | ICD-10-CM | POA: Diagnosis not present

## 2023-02-19 DIAGNOSIS — R059 Cough, unspecified: Secondary | ICD-10-CM | POA: Diagnosis not present

## 2023-02-19 MED ORDER — PREDNISONE 10 MG (21) PO TBPK: ORAL_TABLET | ORAL | 0 refills | Status: DC

## 2023-02-19 MED ORDER — ALBUTEROL SULFATE HFA 108 (90 BASE) MCG/ACT IN AERS: 2.0000 | INHALATION_SPRAY | Freq: Four times a day (QID) | RESPIRATORY_TRACT | 0 refills | Status: DC | PRN

## 2023-02-19 MED ORDER — PROMETHAZINE-DM 6.25-15 MG/5ML PO SYRP: 5.0000 mL | ORAL_SOLUTION | Freq: Three times a day (TID) | ORAL | 0 refills | Status: DC | PRN

## 2023-02-19 NOTE — Patient Instructions (Signed)

## 2023-02-19 NOTE — Progress Notes (Signed)
Subjective:    Patient ID: Alec Clark, male    DOB: 06/20/1961, 62 y.o.   MRN: [id number]  Chief Complaint  Patient presents with   Cough    LONGER THAN 2 WEEKS SEEN MMM. GOTTEN WORSE MAKING HIM THROW UP    Chills   Pt presents to the office today with a cough that started three weeks ago. He completed Augmentin and tessalon without relief.  Cough This is a new problem. The current episode started 1 to 4 weeks ago. The problem has been unchanged. The problem occurs every few minutes. The cough is Non-productive. Associated symptoms include chills and shortness of breath. Pertinent negatives include no ear congestion, ear pain, fever, headaches, nasal congestion or wheezing. He has tried rest and OTC cough suppressant (augmentin) for the symptoms. The treatment provided mild relief. There is no history of asthma or COPD.      Review of Systems  Constitutional:  Positive for chills. Negative for fever.  HENT:  Negative for ear pain.   Respiratory:  Positive for cough and shortness of breath. Negative for wheezing.   Neurological:  Negative for headaches.  All other systems reviewed and are negative.      Objective:   Physical Exam Vitals reviewed.  Constitutional:      General: He is not in acute distress.    Appearance: He is well-developed.  HENT:     Head: Normocephalic.     Right Ear: Tympanic membrane normal.     Left Ear: Tympanic membrane normal.  Eyes:     General:        Right eye: No discharge.        Left eye: No discharge.     Pupils: Pupils are equal, round, and reactive to light.  Neck:     Thyroid: No thyromegaly.  Cardiovascular:     Rate and Rhythm: Normal rate and regular rhythm.     Heart sounds: Normal heart sounds. No murmur heard. Pulmonary:     Effort: Pulmonary effort is normal. No respiratory distress.     Breath sounds: Normal breath sounds. No wheezing.     Comments: Constant nonproductive dry cough Abdominal:     General: Bowel  sounds are normal. There is no distension.     Palpations: Abdomen is soft.     Tenderness: There is no abdominal tenderness.  Musculoskeletal:        General: No tenderness. Normal range of motion.     Cervical back: Normal range of motion and neck supple.  Skin:    General: Skin is warm and dry.     Findings: No erythema or rash.  Neurological:     Mental Status: He is alert and oriented to person, place, and time.     Cranial Nerves: No cranial nerve deficit.     Deep Tendon Reflexes: Reflexes are normal and symmetric.  Psychiatric:        Behavior: Behavior normal.        Thought Content: Thought content normal.        Judgment: Judgment normal.       BP 135/83   Pulse 71   Temp 97.7 F (36.5 C) (Temporal)   Ht '5\' 9"'$  (1.753 m)   Wt 186 lb (84.4 kg)   SpO2 94%   BMI 27.47 kg/m      Assessment & Plan:  Alec Clark comes in today with chief complaint of Cough (LONGER THAN 2 WEEKS SEEN MMM. GOTTEN WORSE MAKING  HIM THROW UP ) and Chills   Diagnosis and orders addressed:  1. Acute bronchitis, unspecified organism - Take meds as prescribed - Use a cool mist humidifier  -Use saline nose sprays frequently -Force fluids -For any cough or congestion  Use plain Mucinex- regular strength or max strength is fine -For fever or aces or pains- take tylenol or ibuprofen. -Throat lozenges if help -Follow up if symptoms worsen or do not improve  - predniSONE (STERAPRED UNI-PAK 21 TAB) 10 MG (21) TBPK tablet; Use as directed  Dispense: 21 tablet; Refill: 0 - promethazine-dextromethorphan (PROMETHAZINE-DM) 6.25-15 MG/5ML syrup; Take 5 mLs by mouth 3 (three) times daily as needed for cough.  Dispense: 473 mL; Refill: 0 - DG Chest 2 View - albuterol (VENTOLIN HFA) 108 (90 Base) MCG/ACT inhaler; Inhale 2 puffs into the lungs every 6 (six) hours as needed for wheezing or shortness of breath.  Dispense: 8 g; Refill: 0     Evelina Dun, FNP

## 2023-02-23 ENCOUNTER — Telehealth: Payer: Self-pay | Admitting: Nurse Practitioner

## 2023-02-23 MED ORDER — AZITHROMYCIN 250 MG PO TABS: ORAL_TABLET | ORAL | 0 refills | Status: DC

## 2023-02-23 NOTE — Telephone Encounter (Signed)
Zpak Prescription sent to pharmacy   

## 2023-02-23 NOTE — Telephone Encounter (Signed)
Patient had an appt on 3/7, said that the cough medication has helped but he is not feeling better. He would like to know if something else can be called in.

## 2023-05-29 ENCOUNTER — Ambulatory Visit: Payer: BC Managed Care – PPO | Admitting: Family Medicine

## 2023-05-29 VITALS — BP 115/79 | HR 65 | Temp 97.9°F | Ht 69.0 in | Wt 179.4 lb

## 2023-05-29 DIAGNOSIS — N529 Male erectile dysfunction, unspecified: Secondary | ICD-10-CM

## 2023-05-29 DIAGNOSIS — R351 Nocturia: Secondary | ICD-10-CM

## 2023-05-29 DIAGNOSIS — R6889 Other general symptoms and signs: Secondary | ICD-10-CM | POA: Diagnosis not present

## 2023-05-29 DIAGNOSIS — Z125 Encounter for screening for malignant neoplasm of prostate: Secondary | ICD-10-CM | POA: Diagnosis not present

## 2023-05-29 DIAGNOSIS — M542 Cervicalgia: Secondary | ICD-10-CM | POA: Diagnosis not present

## 2023-05-29 DIAGNOSIS — R197 Diarrhea, unspecified: Secondary | ICD-10-CM

## 2023-05-29 DIAGNOSIS — G8929 Other chronic pain: Secondary | ICD-10-CM

## 2023-05-29 DIAGNOSIS — M545 Low back pain, unspecified: Secondary | ICD-10-CM | POA: Diagnosis not present

## 2023-05-29 MED ORDER — PREDNISONE 10 MG (21) PO TBPK: ORAL_TABLET | ORAL | 0 refills | Status: DC

## 2023-05-29 MED ORDER — MELOXICAM 15 MG PO TABS: 15.0000 mg | ORAL_TABLET | Freq: Every day | ORAL | 0 refills | Status: DC

## 2023-05-29 MED ORDER — AZITHROMYCIN 250 MG PO TABS: ORAL_TABLET | ORAL | 0 refills | Status: AC

## 2023-05-29 NOTE — Progress Notes (Signed)
Acute Office Visit  Subjective:     Patient ID: Alec Clark, male    DOB: 19-Feb-1961, 62 y.o.   MRN: [id number]  Chief Complaint  Patient presents with   Diarrhea    Has been going on for 2 weeks. Took imodium but then caused him to be constipated and caused stomach pains    Diarrhea  This is a new problem. Episode onset: 2 weeks. Episode frequency: 1-2x a day. The problem has been unchanged. The stool consistency is described as Watery. The patient states that diarrhea awakens (1-2x times) him from sleep. Associated symptoms include abdominal pain (cramping prior to BM), coughing (for a cough of weeks, tickle) and increased flatus. Pertinent negatives include no bloating, chills, fever, headaches, vomiting or weight loss. Associated symptoms comments: Postnasal drip. Risk factors: grandchildren have been sick a few weeks ago after his symptoms. He has tried anti-motility drug, change of diet and increased fluids for the symptoms. The treatment provided no relief. There is no history of bowel resection, inflammatory bowel disease or a recent abdominal surgery.   He also reports lower back pain for the last few weeks. Does not radiate. Pain is worse with movement, bending, twisting. Hx of previous back issues. He has had an xray of his back in 2022. Denies numbness or tingling. Pain is worse in the morning. He feels stiff in the morning.    He also has stiffness in his neck. It feels tight when he turns his head. This has been ongoing for a few months. Denies numbness, tingling, trouble swallowing. He has tried tylenol, advil, massage without improvement. No injury.   He also reports nocturia 3-4x a night. He is having ED as well. Also reports frequency during the day. This has been chronic symptoms. Would like referral to urology.   Review of Systems  Constitutional:  Negative for chills, fever and weight loss.  Respiratory:  Positive for cough (for a cough of weeks, tickle).    Gastrointestinal:  Positive for abdominal pain (cramping prior to BM), diarrhea and flatus. Negative for bloating and vomiting.  Neurological:  Negative for headaches.        Objective:    BP 115/79   Pulse 65   Temp 97.9 F (36.6 C) (Temporal)   Ht 5\' 9"  (1.753 m)   Wt 179 lb 6.4 oz (81.4 kg)   SpO2 93%   BMI 26.49 kg/m  Wt Readings from Last 3 Encounters:  05/29/23 179 lb 6.4 oz (81.4 kg)  02/19/23 186 lb (84.4 kg)  01/30/23 185 lb (83.9 kg)      Physical Exam Vitals and nursing note reviewed.  Constitutional:      General: He is not in acute distress.    Appearance: Normal appearance. He is not ill-appearing, toxic-appearing or diaphoretic.  HENT:     Head: Normocephalic and atraumatic.     Right Ear: A middle ear effusion is present. Tympanic membrane is not perforated, erythematous, retracted or bulging.     Left Ear: A middle ear effusion is present. Tympanic membrane is not perforated, erythematous, retracted or bulging.     Nose: Nose normal.     Mouth/Throat:     Mouth: Mucous membranes are moist.     Pharynx: Oropharynx is clear.  Eyes:     General:        Right eye: No discharge.        Left eye: No discharge.     Conjunctiva/sclera: Conjunctivae normal.  Cardiovascular:  Rate and Rhythm: Normal rate and regular rhythm.     Heart sounds: Normal heart sounds. No murmur heard. Pulmonary:     Effort: Pulmonary effort is normal. No respiratory distress.     Breath sounds: Normal breath sounds. No wheezing.  Abdominal:     General: Bowel sounds are normal. There is no distension.     Palpations: Abdomen is soft.     Tenderness: There is no abdominal tenderness. There is no right CVA tenderness, left CVA tenderness, guarding or rebound.  Musculoskeletal:     Cervical back: Neck supple. No edema, erythema or rigidity. Muscular tenderness present. No spinous process tenderness. Normal range of motion.     Lumbar back: No swelling, edema, deformity, signs  of trauma, lacerations, spasms, tenderness or bony tenderness. Normal range of motion. Negative right straight leg raise test and negative left straight leg raise test.     Right lower leg: No edema.     Left lower leg: No edema.  Skin:    General: Skin is warm.  Neurological:     General: No focal deficit present.     Mental Status: He is alert.  Psychiatric:        Mood and Affect: Mood normal.        Behavior: Behavior normal.     No results found for any visits on 05/29/23.      Assessment & Plan:   Alec Clark was seen today for diarrhea.  Diagnoses and all orders for this visit:  Diarrhea, unspecified type Will treat empirically with zpak. Labs pending. Push fluids, bland diet until improved.  -     azithromycin (ZITHROMAX) 250 MG tablet; Take 2 tablets on day 1, then 1 tablet daily on days 2 through 5 -     CBC with Differential/Platelet -     CMP14+EGFR -     Vitamin B12 -     Lipase  Chronic midline low back pain without sciatica Chronic neck pain Lumbar Xray in 2022 showed degenerative changes. Mobic as below. Do not take other NSAIDs with this. Prednisone taper. Continue stretches, rest, ice, heat. Discussed xrays if no improvement in symptoms.  -     meloxicam (MOBIC) 15 MG tablet; Take 1 tablet (15 mg total) by mouth daily. -     predniSONE (STERAPRED UNI-PAK 21 TAB) 10 MG (21) TBPK tablet; Use as directed on back of pill pack  Erectile dysfunction, unspecified erectile dysfunction type Nocturia PSA discussed and ordered. Referral placed.  -     PSA, total and free -     Ambulatory referral to Urology  Return in about 6 months (around 11/28/2023) for CPE.  The patient indicates understanding of these issues and agrees with the plan.  Alec Earing, FNP

## 2023-05-30 LAB — LIPASE: Lipase: 39 U/L (ref 13–78)

## 2023-05-30 LAB — CMP14+EGFR
ALT: 21 IU/L (ref 0–44)
AST: 20 IU/L (ref 0–40)
Albumin/Globulin Ratio: 2
Albumin: 4.6 g/dL (ref 3.9–4.9)
Alkaline Phosphatase: 64 IU/L (ref 44–121)
BUN/Creatinine Ratio: 13 (ref 10–24)
BUN: 14 mg/dL (ref 8–27)
Bilirubin Total: 0.7 mg/dL (ref 0.0–1.2)
CO2: 22 mmol/L (ref 20–29)
Calcium: 9.4 mg/dL (ref 8.6–10.2)
Chloride: 104 mmol/L (ref 96–106)
Creatinine, Ser: 1.11 mg/dL (ref 0.76–1.27)
Globulin, Total: 2.3 g/dL (ref 1.5–4.5)
Glucose: 99 mg/dL (ref 70–99)
Potassium: 4.7 mmol/L (ref 3.5–5.2)
Sodium: 140 mmol/L (ref 134–144)
Total Protein: 6.9 g/dL (ref 6.0–8.5)
eGFR: 75 mL/min/{1.73_m2} (ref >59)

## 2023-05-30 LAB — CBC WITH DIFFERENTIAL/PLATELET
Basophils Absolute: 0 10*3/uL (ref 0.0–0.2)
Basos: 1 %
EOS (ABSOLUTE): 0.1 10*3/uL (ref 0.0–0.4)
Eos: 1 %
Hematocrit: 45.8 % (ref 37.5–51.0)
Hemoglobin: 15.2 g/dL (ref 13.0–17.7)
Immature Grans (Abs): 0 10*3/uL (ref 0.0–0.1)
Immature Granulocytes: 0 %
Lymphocytes Absolute: 1.6 10*3/uL (ref 0.7–3.1)
Lymphs: 26 %
MCH: 31.1 pg (ref 26.6–33.0)
MCHC: 33.2 g/dL (ref 31.5–35.7)
MCV: 94 fL (ref 79–97)
Monocytes Absolute: 0.5 10*3/uL (ref 0.1–0.9)
Monocytes: 8 %
Neutrophils Absolute: 3.9 10*3/uL (ref 1.4–7.0)
Neutrophils: 64 %
Platelets: 248 10*3/uL (ref 150–450)
RBC: 4.89 x10E6/uL (ref 4.14–5.80)
RDW: 13 % (ref 11.6–15.4)
WBC: 6 10*3/uL (ref 3.4–10.8)

## 2023-05-30 LAB — PSA, TOTAL AND FREE
PSA, Free Pct: 16.7 %
PSA, Free: 0.15 ng/mL
Prostate Specific Ag, Serum: 0.9 ng/mL (ref 0.0–4.0)

## 2023-05-30 LAB — VITAMIN B12: Vitamin B-12: 221 pg/mL — ABNORMAL LOW (ref 232–1245)

## 2023-06-23 ENCOUNTER — Telehealth: Payer: Self-pay | Admitting: Family Medicine

## 2023-06-23 NOTE — Telephone Encounter (Signed)
  Incoming Patient Call  06/23/2023  What symptoms do you have? Neck and shoulder pain  How long have you been sick?Pain started again over the weekend, he was seen for this he was fine after taking steroids now the pain came back was told by Tiffany to call back if pain returned  Have you been seen for this problem? Yes 05/29/2023  If your provider decides to give you a prescription, which pharmacy would you like for it to be sent to? [facility]   Patient informed that this information will be sent to the clinical staff for review and that they should receive a follow up call.

## 2023-06-24 NOTE — Telephone Encounter (Signed)
Spoke with patient, he already has an appointment scheduled tomorrow, 06/25/23.

## 2023-06-25 ENCOUNTER — Encounter: Payer: Self-pay | Admitting: Family Medicine

## 2023-06-25 ENCOUNTER — Ambulatory Visit (INDEPENDENT_AMBULATORY_CARE_PROVIDER_SITE_OTHER): Payer: BC Managed Care – PPO

## 2023-06-25 ENCOUNTER — Ambulatory Visit: Payer: BC Managed Care – PPO | Admitting: Family Medicine

## 2023-06-25 VITALS — BP 144/91 | HR 65 | Temp 97.5°F | Resp 20 | Ht 69.0 in | Wt 181.1 lb

## 2023-06-25 DIAGNOSIS — M542 Cervicalgia: Secondary | ICD-10-CM

## 2023-06-25 DIAGNOSIS — F5104 Psychophysiologic insomnia: Secondary | ICD-10-CM

## 2023-06-25 DIAGNOSIS — R030 Elevated blood-pressure reading, without diagnosis of hypertension: Secondary | ICD-10-CM

## 2023-06-25 DIAGNOSIS — M47812 Spondylosis without myelopathy or radiculopathy, cervical region: Secondary | ICD-10-CM | POA: Diagnosis not present

## 2023-06-25 MED ORDER — TRAZODONE HCL 50 MG PO TABS: 25.0000 mg | ORAL_TABLET | Freq: Every day | ORAL | 1 refills | Status: DC

## 2023-06-25 NOTE — Progress Notes (Signed)
Acute Office Visit  Subjective:     Patient ID: Alec Clark, male    DOB: August 03, 1961, 62 y.o.   MRN: [id number]  Chief Complaint  Patient presents with   Neck Pain   Insomnia   Here with wife today.   Neck Pain  This is a chronic problem. The current episode started more than 1 month ago. The problem occurs intermittently. The pain is associated with nothing. The pain is present in the left side and right side (left > right). The quality of the pain is described as aching (tightness with rotation). The symptoms are aggravated by twisting and position. The pain is Worse during the night (worse at night when laying down). Stiffness is present At night. Pertinent negatives include no numbness, pain with swallowing, tingling, trouble swallowing, visual change or weakness. He has tried acetaminophen, NSAIDs, home exercises, ice and heat (prednisone) for the symptoms. The treatment provided mild relief.  Insomnia Primary symptoms: fragmented sleep, difficulty falling asleep, frequent awakening, no premature morning awakening, malaise/fatigue, no napping.   The current episode started more than one year. The onset quality is gradual. The problem occurs nightly (for the last few weeks). The problem has been waxing and waning since onset. The symptoms are aggravated by pain, anxiety and work stress. How many beverages per day that contain caffeine: 0 - 1.  Past treatments include medication (melatonin, tylenol pm). The treatment provided no relief. Typical bedtime:  11-12 P.M..  How long after going to bed to you fall asleep: over an hour.   Prior diagnostic workup includes:  No prior workup.    Review of Systems  Constitutional:  Positive for malaise/fatigue.  HENT:  Negative for trouble swallowing.   Musculoskeletal:  Positive for neck pain.  Neurological:  Negative for tingling, weakness and numbness.  Psychiatric/Behavioral:  The patient has insomnia.         Objective:    BP (!)  144/91   Pulse 65   Temp (!) 97.5 F (36.4 C) (Oral)   Resp 20   Ht 5\' 9"  (1.753 m)   Wt 181 lb 2 oz (82.2 kg)   SpO2 95%   BMI 26.75 kg/m  BP Readings from Last 3 Encounters:  06/25/23 (!) 144/91  05/29/23 115/79  02/19/23 135/83      Physical Exam Vitals and nursing note reviewed.  Constitutional:      General: He is not in acute distress.    Appearance: Normal appearance. He is not ill-appearing, toxic-appearing or diaphoretic.  Cardiovascular:     Rate and Rhythm: Normal rate and regular rhythm.     Pulses: Normal pulses.     Heart sounds: Normal heart sounds. No murmur heard. Pulmonary:     Effort: Pulmonary effort is normal. No respiratory distress.     Breath sounds: Normal breath sounds.  Musculoskeletal:     Cervical back: Normal range of motion and neck supple. No edema, erythema, signs of trauma, rigidity or crepitus. Pain with movement (rotation to left) and muscular tenderness present. No spinous process tenderness. Normal range of motion.     Right lower leg: No edema.     Left lower leg: No edema.  Skin:    General: Skin is warm and dry.  Neurological:     General: No focal deficit present.     Mental Status: He is alert and oriented to person, place, and time.  Psychiatric:        Mood and Affect: Mood normal.  Behavior: Behavior normal.        Thought Content: Thought content normal.        Judgment: Judgment normal.     No results found for any visits on 06/25/23.      Assessment & Plan:   Oryon was seen today for neck pain and insomnia.  Diagnoses and all orders for this visit:  Acute neck pain Xray with mild degenerative changes. Referral to PT discussed and ordered. Mobic, tylenol, stretching, heat.  -     DG Cervical Spine 2 or 3 views; Future -     Ambulatory referral to Physical Therapy  Chronic insomnia Start trazodone as below. Discussed sleep hygiene.  -     traZODone (DESYREL) 50 MG tablet; Take 0.5-1 tablets (25-50 mg  total) by mouth at bedtime.  Elevated blood pressure reading without diagnosis of hypertension Monitor BP at home, notify for elevated readings.    Return in about 6 weeks (around 08/06/2023) for neck pain follow up.  The patient indicates understanding of these issues and agrees with the plan.   Gabriel Earing, FNP

## 2023-07-07 ENCOUNTER — Ambulatory Visit: Payer: BC Managed Care – PPO

## 2023-07-08 ENCOUNTER — Ambulatory Visit: Payer: BC Managed Care – PPO | Admitting: Urology

## 2023-07-10 ENCOUNTER — Encounter: Payer: Self-pay | Admitting: Physical Therapy

## 2023-07-10 ENCOUNTER — Other Ambulatory Visit: Payer: Self-pay

## 2023-07-10 ENCOUNTER — Ambulatory Visit: Payer: BC Managed Care – PPO | Attending: Family Medicine | Admitting: Physical Therapy

## 2023-07-10 DIAGNOSIS — M542 Cervicalgia: Secondary | ICD-10-CM | POA: Diagnosis not present

## 2023-07-10 DIAGNOSIS — R252 Cramp and spasm: Secondary | ICD-10-CM | POA: Diagnosis not present

## 2023-07-10 DIAGNOSIS — R293 Abnormal posture: Secondary | ICD-10-CM | POA: Diagnosis not present

## 2023-07-10 NOTE — Therapy (Signed)
OUTPATIENT PHYSICAL THERAPY CERVICAL EVALUATION   Patient Name: Alec Clark MRN: [id number] DOB:Dec 19, 1960, 62 y.o., male Today's Date: 07/10/2023  END OF SESSION:  PT End of Session - 07/10/23 1038     Visit Number 1    Number of Visits 13    Date for PT Re-Evaluation 08/21/23    Authorization Type [employer]    Authorization Time Period 07/10/23 to 08/21/23    Authorization - Number of Visits 30    PT Start Time 1017    PT Stop Time 1045   MHP not included in billing   PT Time Calculation (min) 28 min    Activity Tolerance Patient tolerated treatment well    Behavior During Therapy [facility] for tasks assessed/performed             History reviewed. No pertinent past medical history. History reviewed. No pertinent surgical history. Patient Active Problem List   Diagnosis Date Noted   Upper respiratory tract infection 01/28/2022   Sore throat 10/01/2021   Back pain with sciatica 10/01/2021   Pain of right thumb 05/07/2021   Annual physical exam 03/26/2021   Right shoulder pain 03/26/2021   Rash 03/26/2021   Armpit abscess 03/26/2021    PCP: Gabriel Earing, FNP  REFERRING PROVIDER: Gabriel Earing, FNP  REFERRING DIAG: M54.2 (ICD-10-CM) - Acute neck pain  THERAPY DIAG:  Cervicalgia  Cramp and spasm  Abnormal posture  Rationale for Evaluation and Treatment: Rehabilitation  ONSET DATE: 3 months ago (maybe even longer)  SUBJECTIVE:                                                                                                                                                                                                         SUBJECTIVE STATEMENT:  Neck and back started to hurt about 3 months ago, maybe more; I was driving and turned to the left and heard a pop in my neck. Pain has been getting worse and growing, I can't sleep and pain is making this difficult. No numbness going into arms/hands, no issues with fine motor skills. Pain has started shooting  all the way down about 2 weeks ago, nothing going into legs.   Hand dominance: Ambidextrous  PERTINENT HISTORY:  No significant PMH/PSH   PAIN:  Are you having pain? Yes: NPRS scale: 5/10 Pain location: base of skull coming down into shoulder area, started on the L now spreading to R  Pain description: nagging ache  Aggravating factors: movements, looking R or L or up or down Relieving factors: medications   PRECAUTIONS:  None  RED FLAGS: None     WEIGHT BEARING RESTRICTIONS: No  FALLS:  Has patient fallen in last 6 months? 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  PATIENT SURVEYS:  FOTO will do second session   COGNITION: Overall cognitive status: Within functional limits for tasks assessed  SENSATION: WFL  POSTURE: rounded shoulders, forward head, increased thoracic kyphosis, and flexed trunk   PALPATION:  Tight and tender B subocciptials, cervical and thoracic paraspinals, upper traps    CERVICAL ROM:   Active ROM A/PROM (deg) eval  Flexion 41*  Extension 22*  Right lateral flexion 12*  Left lateral flexion 18*  Right rotation 75% limited   Left rotation 75% limited    (Blank rows = not tested)        TODAY'S TREATMENT:                                                                                                                              DATE:   Eval  Objective measures, appropriate education, care planning     TherEx  MHP x10 min (not included  in billing)  Chin tucks x10 with 3 second hold Backward shoulder rolls x15 Upper trap stretch 1x30 seconds B Levator stretch 1x30 seconds B     PATIENT EDUCATION:  Education details: exam findings , POC, HEP, encouraged moist heat rather than electric, education on xray findings and relation to PT  Person educated: Patient Education method: Programmer, multimedia, Demonstration, and Handouts Education comprehension: verbalized understanding, returned demonstration, and needs further education  HOME EXERCISE PROGRAM:  Access Code: [email or web address] URL: [email or web address] Date: 07/10/2023 Prepared by: Nedra Hai  Exercises - Seated Cervical Retraction  - 2 x daily - 7 x weekly - 1 sets - 10 reps - 3 seconds  hold - Standing Backward Shoulder Rolls  - 2 x daily - 7 x weekly - 1 sets - 10-15 reps - Seated Gentle Upper Trapezius Stretch  - 2 x daily - 7 x weekly - 1 sets - 3 reps - 30 seconds  hold - Gentle Levator Scapulae Stretch  - 2 x daily - 7 x weekly - 1 sets - 3 reps - 30 seconds  hold  ASSESSMENT:  CLINICAL IMPRESSION: Patient is a 62 y.o. M who was seen today for physical therapy evaluation and treatment for neck pain. He does report that the pain has been spreading from his L side over to the right as well as further down  his back as well. Exam with objective findings as above. Did apply moist heat (not included in billing) to address mm spasms and reduce pain this first session, also to improve efficacy of stretches and exercises/increase blood flow. Will benefit from skilled PT services to address all impairments, reduce pain, and assist in return to optimal level of function.    OBJECTIVE IMPAIRMENTS: decreased ROM, hypomobility, increased fascial restrictions, increased muscle spasms, impaired flexibility, postural dysfunction, and pain.   ACTIVITY LIMITATIONS: carrying, lifting, sleeping, and reach over head  PARTICIPATION LIMITATIONS: community activity, occupation,  and yard work  PERSONAL FACTORS: Age, Behavior pattern, Education, Past/current experiences, Social background, and Time since onset of injury/illness/exacerbation are also affecting patient's functional outcome.   REHAB POTENTIAL: Excellent  CLINICAL DECISION MAKING: Stable/uncomplicated  EVALUATION COMPLEXITY: Low   GOALS: Goals reviewed with patient? Yes  SHORT TERM GOALS: Target date: 07/31/2023    Will be compliant with appropriate progressive HEP  Baseline:  Goal status: INITIAL  2.  Pain to be no more than 3/10 at worst  Baseline:  Goal status: INITIAL  3.  Will demonstrate improved awareness of general posture with functional task performance  Baseline:  Goal status: INITIAL    LONG TERM GOALS: Target date: 08/21/2023    Cervical ROM to have normalized with no more than 10* restriction all planes of motion and no pain  Baseline:  Goal status: INITIAL  2.  Pain that had spread to thoracic and lumbar spines to have resolved  Baseline:  Goal status: INITIAL  3.  Muscle spasm and trigger points to have improved by at least 75% Baseline:  Goal status: INITIAL  4.  Will be able to freely move neck as desired in order to drive and perform all required tasks at work pain free  Baseline:  Goal status: INITIAL  5.  Will report no further sleep interruptions due to neck pain  Baseline:  Goal status: INITIAL     PLAN:  PT FREQUENCY: 1-2x/week  PT DURATION: 6 weeks  PLANNED INTERVENTIONS: Therapeutic exercises, Therapeutic activity, Patient/Family education, Self Care, Joint mobilization, Dry Needling, Electrical stimulation, Cryotherapy, Moist heat, Taping, Ultrasound, Ionotophoresis 4mg /ml Dexamethasone, Manual therapy, and Re-evaluation  PLAN FOR NEXT SESSION: postural training/strengthening, MHP and other modalities as desired, , thoracic and lumbar ROM PRN to address pain in these areas, manual and DN   Nedra Hai, PT, DPT 07/10/23 11:07  AM

## 2023-07-15 ENCOUNTER — Encounter: Payer: Self-pay | Admitting: Physical Therapy

## 2023-07-15 ENCOUNTER — Ambulatory Visit: Payer: BC Managed Care – PPO | Admitting: Physical Therapy

## 2023-07-15 DIAGNOSIS — R252 Cramp and spasm: Secondary | ICD-10-CM

## 2023-07-15 DIAGNOSIS — R293 Abnormal posture: Secondary | ICD-10-CM | POA: Diagnosis not present

## 2023-07-15 DIAGNOSIS — M542 Cervicalgia: Secondary | ICD-10-CM | POA: Diagnosis not present

## 2023-07-15 NOTE — Therapy (Addendum)
OUTPATIENT PHYSICAL THERAPY CERVICAL EVALUATION   Patient Name: Alec Clark MRN: [id number] DOB:[birth year], 62 y.o., male Today's Date: 07/15/2023  END OF SESSION:  PT End of Session - 07/15/23 1144     Visit Number 2    Number of Visits 13    Date for PT Re-Evaluation 08/21/23    Authorization Time Period 07/10/23 to 08/21/23    Authorization - Number of Visits 30    PT Start Time 1015    PT Stop Time 1108    PT Time Calculation (min) 53 min    Activity Tolerance Patient tolerated treatment well    Behavior During Therapy [facility] for tasks assessed/performed             History reviewed. No pertinent past medical history. History reviewed. No pertinent surgical history. Patient Active Problem List   Diagnosis Date Noted   Upper respiratory tract infection 01/28/2022   Sore throat 10/01/2021   Back pain with sciatica 10/01/2021   Pain of right thumb 05/07/2021   Annual physical exam 03/26/2021   Right shoulder pain 03/26/2021   Rash 03/26/2021   Armpit abscess 03/26/2021    PCP: Gabriel Earing, FNP  REFERRING PROVIDER: Gabriel Earing, FNP  REFERRING DIAG: M54.2 (ICD-10-CM) - Acute neck pain  THERAPY DIAG:  Cervicalgia  Cramp and spasm  Abnormal posture  Rationale for Evaluation and Treatment: Rehabilitation  ONSET DATE: 3 months ago (maybe even longer)  SUBJECTIVE:                                                                                                                                                                                                         SUBJECTIVE STATEMENT:  Been doing the neck stretches.  I have a headache today.  PERTINENT HISTORY:  No significant PMH/PSH   PAIN:  Are you having pain? Yes: NPRS scale: 5/10 Pain location: base of skull coming down into shoulder area, started on the L now spreading to R  Pain description: nagging ache  Aggravating factors: movements, looking R or L or up or down Relieving  factors: medications   PRECAUTIONS: None  RED FLAGS: None     WEIGHT BEARING RESTRICTIONS: No  FALLS:  Has patient fallen in last 6 months? 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  PATIENT SURVEYS:  FOTO will do second session   COGNITION: Overall cognitive status: Within functional limits for tasks assessed  SENSATION: WFL  POSTURE: rounded shoulders, forward head, increased thoracic kyphosis, and flexed trunk   PALPATION:  Tight and tender B subocciptials, cervical and thoracic paraspinals, upper traps    CERVICAL ROM:   Active ROM A/PROM (deg) eval  Flexion 41*  Extension 22*  Right lateral flexion 12*  Left lateral flexion 18*  Right rotation 75% limited   Left rotation 75% limited    (Blank rows = not tested)        TODAY'S TREATMENT:                                                                                                                              DATE: 07/15/23:  Combo e'stim/US at 1.50 W/CM2 x 12 minutes f/b STW/M x 11 minutes to patient's bilateral cervical paraspinal musculature and UT's f/b HMP and IFC at 80-150 Hz on 40% scan x 20 minutes.  Normal modality response following removal of modality.    PATIENT EDUCATION:  Education details: See below. Person educated: Patient Education method: Education comprehension: verbalized understanding, returned demonstration, and needs further education  HOME EXERCISE PROGRAM:  Chin tucks and extension  and done with a towel as well.  ASSESSMENT:  CLINICAL IMPRESSION: Patient presented to the clinic today with a headaches.  He had notable tone over bilateral cervical musculature.  Good response to treatment and feeling better after treatment.  We discussed the importance of correct posture and added to his HEP.  He is also trying to find a better pillow.  OBJECTIVE IMPAIRMENTS: decreased ROM, hypomobility, increased fascial restrictions, increased muscle spasms, impaired flexibility, postural dysfunction, and pain.   ACTIVITY LIMITATIONS: carrying, lifting, sleeping, and reach over head  PARTICIPATION LIMITATIONS: community activity, occupation, and yard work  PERSONAL FACTORS: Age, Behavior pattern, Education, Past/current experiences, Social background, and Time since onset of injury/illness/exacerbation are also affecting patient's functional outcome.   REHAB POTENTIAL: Excellent  CLINICAL DECISION MAKING: Stable/uncomplicated  EVALUATION COMPLEXITY: Low   GOALS: Goals reviewed with patient? Yes  SHORT TERM GOALS: Target date: 07/31/2023    Will be compliant with appropriate progressive HEP  Baseline:  Goal status: INITIAL  2.  Pain to be no more than 3/10 at worst  Baseline:  Goal status: INITIAL  3.  Will demonstrate improved awareness of general posture with functional task performance  Baseline:  Goal status: INITIAL    LONG TERM GOALS: Target date: 08/21/2023    Cervical ROM to have normalized with no more than 10* restriction all planes of motion and no pain  Baseline:  Goal status: INITIAL  2.  Pain that had spread to  thoracic and lumbar spines to have resolved  Baseline:  Goal status: INITIAL  3.  Muscle spasm and trigger points to have improved by at least 75% Baseline:  Goal status: INITIAL  4.  Will be able to freely move neck as desired in order to drive and perform all required tasks at work pain free  Baseline:  Goal status: INITIAL  5.   Will report no further sleep interruptions due to neck pain  Baseline:  Goal status: INITIAL     PLAN:  PT FREQUENCY: 1-2x/week  PT DURATION: 6 weeks  PLANNED INTERVENTIONS: Therapeutic exercises, Therapeutic activity, Patient/Family education, Self Care, Joint mobilization, Dry Needling, Electrical stimulation, Cryotherapy, Moist heat, Taping, Ultrasound, Ionotophoresis 4mg /ml Dexamethasone, Manual therapy, and Re-evaluation  PLAN FOR NEXT SESSION: postural training/strengthening, MHP and other modalities as desired, , thoracic and lumbar ROM PRN to address pain in these areas, manual and DN   [place] Ivann Trimarco MPT

## 2023-07-17 ENCOUNTER — Ambulatory Visit: Payer: BC Managed Care – PPO | Attending: Family Medicine | Admitting: Physical Therapy

## 2023-07-17 DIAGNOSIS — M542 Cervicalgia: Secondary | ICD-10-CM | POA: Diagnosis not present

## 2023-07-17 DIAGNOSIS — R252 Cramp and spasm: Secondary | ICD-10-CM | POA: Insufficient documentation

## 2023-07-17 DIAGNOSIS — R293 Abnormal posture: Secondary | ICD-10-CM | POA: Insufficient documentation

## 2023-07-17 NOTE — Therapy (Signed)
OUTPATIENT PHYSICAL THERAPY CERVICAL EVALUATION   Patient Name: Alec Clark MRN: [id number] DOB:1961-05-23, 62 y.o., male Today's Date: 07/17/2023  END OF SESSION:  PT End of Session - 07/17/23 1345     Visit Number 3    Number of Visits 13    Date for PT Re-Evaluation 08/21/23    PT Start Time 1009    PT Stop Time 1105    PT Time Calculation (min) 56 min    Activity Tolerance Patient tolerated treatment well    Behavior During Therapy [facility] for tasks assessed/performed             No past medical history on file. No past surgical history on file. Patient Active Problem List   Diagnosis Date Noted   Upper respiratory tract infection 01/28/2022   Sore throat 10/01/2021   Back pain with sciatica 10/01/2021   Pain of right thumb 05/07/2021   Annual physical exam 03/26/2021   Right shoulder pain 03/26/2021   Rash 03/26/2021   Armpit abscess 03/26/2021    PCP: Gabriel Earing, FNP  REFERRING PROVIDER: Gabriel Earing, FNP  REFERRING DIAG: M54.2 (ICD-10-CM) - Acute neck pain  THERAPY DIAG:  Cervicalgia  Cramp and spasm  Abnormal posture  Rationale for Evaluation and Treatment: Rehabilitation  ONSET DATE: 3 months ago (maybe even longer)  SUBJECTIVE:                                                                                                                                                                                                         SUBJECTIVE STATEMENT:  Pain at a 6 but no headache.  PERTINENT HISTORY:  No significant PMH/PSH   PAIN:  Are you having pain? Yes: NPRS scale: 6/10 Pain location: base of skull coming down into shoulder area, started on the L now spreading to R  Pain description: nagging ache  Aggravating factors: movements, looking R or L or up or down Relieving factors: medications   PRECAUTIONS: None  RED FLAGS: None     WEIGHT BEARING RESTRICTIONS: No  FALLS:  Has patient fallen in last 6 months?  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  PATIENT SURVEYS:  FOTO will do second session   COGNITION: Overall cognitive status: Within functional limits for tasks assessed  SENSATION: WFL  POSTURE: rounded shoulders, forward head, increased thoracic kyphosis, and flexed trunk   PALPATION:  Tight and tender B subocciptials, cervical and thoracic paraspinals, upper traps    CERVICAL ROM:   Active ROM A/PROM (deg) eval  Flexion 41*  Extension 22*  Right lateral flexion 12*  Left lateral flexion 18*  Right rotation 75% limited   Left rotation 75% limited    (Blank rows = not tested)        TODAY'S TREATMENT:                                                                                                                              DATE: 07/17/23:  Combo e'stim/US at 1.50 W/CM2 x 12 minutes f/b  STW/M x 11 minutes to patient's bilateral cervical paraspinal musculature and UT's f/b HMP and IFC at 80-150 Hz on 40% scan x 20 minutes.  Normal modality response following removal of modality.    PATIENT EDUCATION:  Education details: See below. Person educated: Patient Education method: Education comprehension: verbalized understanding, returned demonstration, and needs further education  HOME EXERCISE PROGRAM:  Chin tucks and extension and done with a towel as well.  ASSESSMENT:  CLINICAL IMPRESSION: The patient pleased that he does not have a headache today.  He is becoming more  aware of his posture and trying to avoid keeping his neck flexed while on his phone.  Provided patient with a handout/consent form for dry needling.    OBJECTIVE IMPAIRMENTS: decreased ROM, hypomobility, increased fascial restrictions, increased muscle spasms, impaired flexibility, postural dysfunction, and pain.   ACTIVITY LIMITATIONS: carrying, lifting, sleeping, and reach over head  PARTICIPATION LIMITATIONS: community activity, occupation, and yard work  PERSONAL FACTORS: Age, Behavior pattern, Education, Past/current experiences, Social background, and Time since onset of injury/illness/exacerbation are also affecting patient's functional outcome.   REHAB POTENTIAL: Excellent  CLINICAL DECISION MAKING: Stable/uncomplicated  EVALUATION COMPLEXITY: Low   GOALS: Goals reviewed with patient? Yes  SHORT TERM GOALS: Target date: 07/31/2023    Will be compliant with appropriate progressive HEP  Baseline:  Goal status: INITIAL  2.  Pain to be no more than 3/10 at worst  Baseline:  Goal status: INITIAL  3.  Will demonstrate improved awareness of general posture with functional task performance  Baseline:  Goal status: INITIAL    LONG TERM GOALS: Target date: 08/21/2023    Cervical ROM to have normalized with no more than 10* restriction all planes of motion and no pain  Baseline:  Goal status: INITIAL  2.  Pain that had spread to thoracic and lumbar spines to have resolved  Baseline:  Goal status: INITIAL  3.  Muscle spasm and trigger points to have improved by at least 75% Baseline:  Goal status: INITIAL  4.  Will be able to freely move neck as desired in order to drive and perform all required tasks at work pain free  Baseline:  Goal status: INITIAL  5.  Will report no further sleep interruptions due to neck pain  Baseline:  Goal status: INITIAL     PLAN:  PT FREQUENCY: 1-2x/week  PT DURATION: 6 weeks  PLANNED INTERVENTIONS: Therapeutic exercises,  Therapeutic activity, Patient/Family education, Self Care, Joint mobilization, Dry Needling, Electrical stimulation, Cryotherapy, Moist heat, Taping, Ultrasound, Ionotophoresis 4mg /ml Dexamethasone, Manual therapy, and Re-evaluation  PLAN FOR NEXT SESSION: postural training/strengthening, MHP and other modalities as desired, , thoracic and lumbar ROM PRN to address pain in these areas, manual and DN   [place] Nija Koopman MPT

## 2023-07-24 ENCOUNTER — Ambulatory Visit: Payer: BC Managed Care – PPO | Admitting: Physical Therapy

## 2023-07-24 ENCOUNTER — Encounter: Payer: Self-pay | Admitting: Physical Therapy

## 2023-07-24 DIAGNOSIS — R293 Abnormal posture: Secondary | ICD-10-CM

## 2023-07-24 DIAGNOSIS — M542 Cervicalgia: Secondary | ICD-10-CM

## 2023-07-24 DIAGNOSIS — R252 Cramp and spasm: Secondary | ICD-10-CM

## 2023-07-24 NOTE — Therapy (Signed)
OUTPATIENT PHYSICAL THERAPY CERVICAL EVALUATION   Patient Name: Alec Clark MRN: [id number] DOB:January 23, 1961, 62 y.o., male Today's Date: 07/24/2023  END OF SESSION:  PT End of Session - 07/24/23 1131     Visit Number 4    Number of Visits 13    Date for PT Re-Evaluation 08/21/23    Authorization Type BCBS    PT Start Time 1015    PT Stop Time 1110    PT Time Calculation (min) 55 min    Activity Tolerance Patient tolerated treatment well    Behavior During Therapy [facility] for tasks assessed/performed             History reviewed. No pertinent past medical history. History reviewed. No pertinent surgical history. Patient Active Problem List   Diagnosis Date Noted   Upper respiratory tract infection 01/28/2022   Sore throat 10/01/2021   Back pain with sciatica 10/01/2021   Pain of right thumb 05/07/2021   Annual physical exam 03/26/2021   Right shoulder pain 03/26/2021   Rash 03/26/2021   Armpit abscess 03/26/2021    PCP: Gabriel Earing, FNP  REFERRING PROVIDER: Gabriel Earing, FNP  REFERRING DIAG: M54.2 (ICD-10-CM) - Acute neck pain  THERAPY DIAG:  Cervicalgia  Cramp and spasm  Abnormal posture  Rationale for Evaluation and Treatment: Rehabilitation  ONSET DATE: 3 months ago (maybe even longer)  SUBJECTIVE:                                                                                                                                                                                                         SUBJECTIVE STATEMENT:  Pain more on left side today and have headache. PERTINENT HISTORY:  No significant PMH/PSH   PAIN:  Are you having pain? Yes: NPRS scale: 6/10 Pain location: base of skull coming down into shoulder area, started on the L now spreading to R  Pain description: nagging ache  Aggravating factors: movements, looking R or L or up or down Relieving factors: medications   PRECAUTIONS: None  RED FLAGS: None     WEIGHT  BEARING RESTRICTIONS: No  FALLS:  Has patient fallen in last 6 months? 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  PATIENT SURVEYS:  FOTO will do second session   COGNITION: Overall cognitive status: Within functional limits for tasks assessed  SENSATION: WFL  POSTURE: rounded shoulders, forward head, increased thoracic kyphosis, and flexed trunk   PALPATION:  Tight and tender B subocciptials, cervical and thoracic paraspinals, upper traps    CERVICAL ROM:   Active ROM A/PROM (deg) eval  Flexion 41*  Extension 22*  Right lateral flexion 12*  Left lateral flexion 18*  Right rotation 75% limited   Left rotation 75% limited    (Blank rows = not tested)        TODAY'S TREATMENT:                                                                                                                              DATE: 07/24/23:  Trigger Point Dry-Needling  Treatment instructions: Expect mild to moderate muscle soreness. S/S of pneumothorax if dry needled over a lung field, and to seek immediate medical attention should they occur. Patient verbalized understanding of these instructions and education. Patient Consent Given: Yes Education handout provided: Yes Muscles treated: Left splenius capitus/cervicis f/b   Combo e'stim/US at 1.50 W/CM2 x 12 minutes f/b  STW/M x 12 minutes to patient's bilateral cervical paraspinal musculature and UT's f/b HMP and IFC at 80-150 Hz on 40% scan x 20 minutes.   Normal modality response following removal of modality.    PATIENT EDUCATION:  Education details: See below. Person educated: Patient Education method: Education comprehension: verbalized understanding, returned demonstration, and needs further education  HOME EXERCISE PROGRAM:  Chin tucks and extension and done with a towel as well.  ASSESSMENT:  CLINICAL IMPRESSION: Excellent response with dry needling.  Patient left clinic without a headache.   OBJECTIVE IMPAIRMENTS: decreased ROM, hypomobility, increased fascial restrictions, increased muscle spasms, impaired flexibility, postural dysfunction, and pain.   ACTIVITY LIMITATIONS: carrying, lifting, sleeping, and reach over head  PARTICIPATION LIMITATIONS: community activity, occupation, and yard work  PERSONAL FACTORS: Age, Behavior pattern, Education, Past/current experiences, Social background, and Time since onset of injury/illness/exacerbation are also affecting patient's functional outcome.   REHAB POTENTIAL: Excellent  CLINICAL DECISION MAKING: Stable/uncomplicated  EVALUATION COMPLEXITY: Low   GOALS: Goals reviewed with patient? Yes  SHORT TERM GOALS: Target date: 07/31/2023    Will be compliant with appropriate progressive HEP  Baseline:  Goal status: INITIAL  2.  Pain to be no more than 3/10 at worst  Baseline:  Goal status: INITIAL  3.  Will demonstrate improved awareness of general posture with functional task performance  Baseline:  Goal status: INITIAL    LONG TERM GOALS: Target date: 08/21/2023    Cervical ROM to have normalized with no more than 10* restriction all planes of motion and no pain  Baseline:  Goal status: INITIAL  2.  Pain that  had spread to thoracic and lumbar spines to have resolved  Baseline:  Goal status: INITIAL  3.  Muscle spasm and trigger points to have improved by at least 75% Baseline:  Goal status: INITIAL  4.  Will be able to freely move neck as desired in  order to drive and perform all required tasks at work pain free  Baseline:  Goal status: INITIAL  5.  Will report no further sleep interruptions due to neck pain  Baseline:  Goal status: INITIAL     PLAN:  PT FREQUENCY: 1-2x/week  PT DURATION: 6 weeks  PLANNED INTERVENTIONS: Therapeutic exercises, Therapeutic activity, Patient/Family education, Self Care, Joint mobilization, Dry Needling, Electrical stimulation, Cryotherapy, Moist heat, Taping, Ultrasound, Ionotophoresis 4mg /ml Dexamethasone, Manual therapy, and Re-evaluation  PLAN FOR NEXT SESSION: postural training/strengthening, MHP and other modalities as desired, , thoracic and lumbar ROM PRN to address pain in these areas, manual and DN   [place] Adeleine Pask MPT

## 2023-07-28 ENCOUNTER — Ambulatory Visit: Payer: BC Managed Care – PPO | Admitting: Physical Therapy

## 2023-07-28 DIAGNOSIS — M542 Cervicalgia: Secondary | ICD-10-CM

## 2023-07-28 DIAGNOSIS — R293 Abnormal posture: Secondary | ICD-10-CM | POA: Diagnosis not present

## 2023-07-28 DIAGNOSIS — R252 Cramp and spasm: Secondary | ICD-10-CM | POA: Diagnosis not present

## 2023-07-28 NOTE — Therapy (Signed)
OUTPATIENT PHYSICAL THERAPY CERVICAL EVALUATION   Patient Name: Alec Clark MRN: [id number] DOB:Aug 21, 1961, 62 y.o., male Today's Date: 07/28/2023  END OF SESSION:  PT End of Session - 07/28/23 1207     Visit Number 5    Number of Visits 13    Date for PT Re-Evaluation 08/21/23    PT Start Time 1100    PT Stop Time 1200    PT Time Calculation (min) 60 min    Activity Tolerance Patient tolerated treatment well    Behavior During Therapy [facility] for tasks assessed/performed             No past medical history on file. No past surgical history on file. Patient Active Problem List   Diagnosis Date Noted   Upper respiratory tract infection 01/28/2022   Sore throat 10/01/2021   Back pain with sciatica 10/01/2021   Pain of right thumb 05/07/2021   Annual physical exam 03/26/2021   Right shoulder pain 03/26/2021   Rash 03/26/2021   Armpit abscess 03/26/2021    PCP: Gabriel Earing, FNP  REFERRING PROVIDER: Gabriel Earing, FNP  REFERRING DIAG: M54.2 (ICD-10-CM) - Acute neck pain  THERAPY DIAG:  Cervicalgia  Cramp and spasm  Abnormal posture  Rationale for Evaluation and Treatment: Rehabilitation  ONSET DATE: 3 months ago (maybe even longer)  SUBJECTIVE:                                                                                                                                                                                                         SUBJECTIVE STATEMENT:  Lifted a heavy piece of glass last night at work and now has increased low back pain.  Neck doing better.  Did well with dry needling. PERTINENT HISTORY:  No significant PMH/PSH   PAIN:  Are you having pain? Yes: NPRS scale: 5/10 Pain location: base of skull coming down into shoulder area, started on the L now spreading to R  Pain description: nagging ache  Aggravating factors: movements, looking R or L or up or down Relieving factors: medications   PRECAUTIONS: None  RED  FLAGS: None     WEIGHT BEARING RESTRICTIONS: No  FALLS:  Has patient fallen in last 6 months? 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  PATIENT SURVEYS:  FOTO will do second session   COGNITION: Overall cognitive status: Within functional limits for tasks assessed  SENSATION: WFL  POSTURE: rounded shoulders, forward head, increased thoracic kyphosis, and flexed trunk   PALPATION:  Tight and tender B subocciptials, cervical and thoracic paraspinals, upper traps    CERVICAL ROM:   Active ROM A/PROM (deg) eval  Flexion 41*  Extension 22*  Right lateral flexion 12*  Left lateral flexion 18*  Right rotation 75% limited   Left rotation 75% limited    (Blank rows = not tested)        TODAY'S TREATMENT:                                                                                                                              DATE: 07/24/23:  Trigger Point Dry-Needling  Treatment instructions: Expect mild to moderate muscle soreness. S/S of pneumothorax if dry needled over a lung field, and to seek immediate medical attention should they occur. Patient verbalized understanding of these instructions and education. Patient Consent Given: Yes Education handout provided: Yes Muscles treated: Right splenius capitus/cervicis f/b   Combo e'stim/US at 1.50 W/CM2 x 12 minutes f/b  STW/M x 12 minutes to patient's bilateral cervical paraspinal musculature and UT's f/b HMP and IFC at 80-150 Hz  on 40% scan x 20 minutes.  Normal modality response following removal of modality.    PATIENT EDUCATION:  Education details: See below. Person educated: Patient Education method: Education comprehension: verbalized understanding, returned demonstration, and needs further education  HOME EXERCISE PROGRAM:  Chin tucks and extension and done with a towel as well.  ASSESSMENT:  CLINICAL IMPRESSION: Very good response to dry needling last session on left.  Did right upper cervical musculature today with another good response with patient remarking afterwards he felt "much better."    OBJECTIVE IMPAIRMENTS: decreased ROM, hypomobility, increased fascial restrictions, increased muscle spasms, impaired flexibility, postural dysfunction, and pain.   ACTIVITY LIMITATIONS: carrying, lifting, sleeping, and reach over head  PARTICIPATION LIMITATIONS: community activity, occupation, and yard work  PERSONAL FACTORS: Age, Behavior pattern, Education, Past/current experiences, Social background, and Time since onset of injury/illness/exacerbation are also affecting patient's functional outcome.   REHAB POTENTIAL: Excellent  CLINICAL DECISION MAKING: Stable/uncomplicated  EVALUATION COMPLEXITY: Low   GOALS: Goals reviewed with patient? Yes  SHORT TERM GOALS: Target date: 07/31/2023    Will be compliant with appropriate progressive HEP  Baseline:  Goal status: INITIAL  2.  Pain to be no more than 3/10 at worst  Baseline:  Goal status: INITIAL  3.  Will demonstrate improved awareness of general posture with functional task performance  Baseline:  Goal status: INITIAL    LONG TERM GOALS: Target date: 08/21/2023  Cervical ROM to have normalized with no more than 10* restriction all planes of motion and no pain  Baseline:  Goal status: INITIAL  2.  Pain that had spread to thoracic and lumbar spines to have resolved  Baseline:  Goal status: INITIAL  3.  Muscle spasm and  trigger points to have improved by at least 75% Baseline:  Goal status: INITIAL  4.  Will be able to freely move neck as desired in order to drive and perform all required tasks at work pain free  Baseline:  Goal status: INITIAL  5.  Will report no further sleep interruptions due to neck pain  Baseline:  Goal status: INITIAL     PLAN:  PT FREQUENCY: 1-2x/week  PT DURATION: 6 weeks  PLANNED INTERVENTIONS: Therapeutic exercises, Therapeutic activity, Patient/Family education, Self Care, Joint mobilization, Dry Needling, Electrical stimulation, Cryotherapy, Moist heat, Taping, Ultrasound, Ionotophoresis 4mg /ml Dexamethasone, Manual therapy, and Re-evaluation  PLAN FOR NEXT SESSION: postural training/strengthening, MHP and other modalities as desired, , thoracic and lumbar ROM PRN to address pain in these areas, manual and DN   [place] Xavier Fournier MPT

## 2023-07-31 ENCOUNTER — Ambulatory Visit: Payer: BC Managed Care – PPO | Admitting: *Deleted

## 2023-07-31 DIAGNOSIS — R293 Abnormal posture: Secondary | ICD-10-CM

## 2023-07-31 DIAGNOSIS — M542 Cervicalgia: Secondary | ICD-10-CM

## 2023-07-31 DIAGNOSIS — R252 Cramp and spasm: Secondary | ICD-10-CM | POA: Diagnosis not present

## 2023-07-31 NOTE — Therapy (Signed)
OUTPATIENT PHYSICAL THERAPY CERVICAL Treatment   Patient Name: Alec Clark MRN: [id number] DOB:10/06/1961, 62 y.o., male Today's Date: 07/31/2023  END OF SESSION:  PT End of Session - 07/31/23 1037     Visit Number 6    Number of Visits 13    Date for PT Re-Evaluation 08/21/23    Authorization Type BCBS    Authorization Time Period 07/10/23 to 08/21/23    Authorization - Number of Visits 30    PT Start Time 1015    PT Stop Time 1105    PT Time Calculation (min) 50 min             No past medical history on file. No past surgical history on file. Patient Active Problem List   Diagnosis Date Noted   Upper respiratory tract infection 01/28/2022   Sore throat 10/01/2021   Back pain with sciatica 10/01/2021   Pain of right thumb 05/07/2021   Annual physical exam 03/26/2021   Right shoulder pain 03/26/2021   Rash 03/26/2021   Armpit abscess 03/26/2021    PCP: Gabriel Earing, FNP  REFERRING PROVIDER: Gabriel Earing, FNP  REFERRING DIAG: M54.2 (ICD-10-CM) - Acute neck pain  THERAPY DIAG:  Cervicalgia  Cramp and spasm  Abnormal posture  Rationale for Evaluation and Treatment: Rehabilitation  ONSET DATE: 3 months ago (maybe even longer)  SUBJECTIVE:                                                                                                                                                                                                         SUBJECTIVE STATEMENT:    Neck doing better.  Did well with dry needling. 5/10 today PERTINENT HISTORY:  No significant PMH/PSH   PAIN:  Are you having pain? Yes: NPRS scale: 5/10 Pain location: base of skull coming down into shoulder area, started on the L now spreading to R  Pain description: nagging ache  Aggravating factors: movements, looking R or L or up or down Relieving factors: medications   PRECAUTIONS: None  RED FLAGS: None     WEIGHT BEARING RESTRICTIONS: No  FALLS:  Has patient  fallen in last 6 months? 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  PATIENT SURVEYS:  FOTO will do second session   COGNITION: Overall cognitive status: Within functional limits for tasks assessed  SENSATION: WFL  POSTURE: rounded shoulders, forward head, increased thoracic kyphosis, and flexed trunk   PALPATION:  Tight and tender B subocciptials, cervical and thoracic paraspinals, upper traps    CERVICAL ROM:   Active ROM A/PROM (deg) eval  Flexion 41*  Extension 22*  Right lateral flexion 12*  Left lateral flexion 18*  Right rotation 75% limited   Left rotation 75% limited    (Blank rows = not tested)        TODAY'S TREATMENT:                                                                                                                              DATE:                                                07/31/23:    Patient Consent Given: Yes Education handout provided: Yes  Combo e'stim/US at 1.50 W/CM2 x 12 minutes  6 mins each side Cerv. Paras  STW/M x 12 minutes to patient's bilateral cervical paraspinal musculature UT's   HMP and IFC at 80-150 Hz on 40% scan x 15 minutes.  Normal modality response following removal of modality.    PATIENT EDUCATION:  Education details: See below. Person educated: Patient Education method: Education comprehension: verbalized understanding, returned demonstration, and needs further education  HOME EXERCISE PROGRAM:  Chin tucks and  extension and done with a towel as well.  ASSESSMENT:  CLINICAL IMPRESSION: Pt arrived today doing fairly well and reports doing atleast 40% better over all.  Rx focused on pain and mm tightness reduction both sides Cerv. Paras  and UT's and did great.   OBJECTIVE IMPAIRMENTS: decreased ROM, hypomobility, increased fascial restrictions, increased muscle spasms, impaired flexibility, postural dysfunction, and pain.   ACTIVITY LIMITATIONS: carrying, lifting, sleeping, and reach over head  PARTICIPATION LIMITATIONS: community activity, occupation, and yard work  PERSONAL FACTORS: Age, Behavior pattern, Education, Past/current experiences, Social background, and Time since onset of injury/illness/exacerbation are also affecting patient's functional outcome.   REHAB POTENTIAL: Excellent  CLINICAL DECISION MAKING: Stable/uncomplicated  EVALUATION COMPLEXITY: Low   GOALS: Goals reviewed with patient? Yes  SHORT TERM GOALS: Target date: 07/31/2023    Will be compliant with appropriate progressive HEP  Baseline:  Goal status: INITIAL  2.  Pain to be no more than 3/10 at worst  Baseline:  Goal status: INITIAL  3.  Will demonstrate improved awareness of general posture with functional task performance  Baseline:  Goal status: INITIAL    LONG TERM GOALS: Target date: 08/21/2023  Cervical ROM to have normalized with no more than 10* restriction all planes of motion and no pain  Baseline:  Goal status: INITIAL  2.  Pain that had spread to thoracic and lumbar spines to have resolved  Baseline:  Goal status: INITIAL  3.  Muscle spasm and trigger points to have improved by at least 75% Baseline:  Goal status: INITIAL  4.  Will be able to freely move neck as desired in order to drive and perform all required tasks at work pain free  Baseline:  Goal status: INITIAL  5.  Will report no further sleep interruptions due to neck pain  Baseline:  Goal status:  INITIAL     PLAN:  PT FREQUENCY: 1-2x/week  PT DURATION: 6 weeks  PLANNED INTERVENTIONS: Therapeutic exercises, Therapeutic activity, Patient/Family education, Self Care, Joint mobilization, Dry Needling, Electrical stimulation, Cryotherapy, Moist heat, Taping, Ultrasound, Ionotophoresis 4mg /ml Dexamethasone, Manual therapy, and Re-evaluation  PLAN FOR NEXT SESSION: postural training/strengthening, MHP and other modalities as desired, , thoracic and lumbar ROM PRN to address pain in these areas, manual and DN   [place] App[place] MPT

## 2023-08-04 ENCOUNTER — Ambulatory Visit: Payer: BC Managed Care – PPO | Admitting: Physical Therapy

## 2023-08-04 DIAGNOSIS — R293 Abnormal posture: Secondary | ICD-10-CM | POA: Diagnosis not present

## 2023-08-04 DIAGNOSIS — M542 Cervicalgia: Secondary | ICD-10-CM | POA: Diagnosis not present

## 2023-08-04 DIAGNOSIS — R252 Cramp and spasm: Secondary | ICD-10-CM

## 2023-08-04 NOTE — Therapy (Addendum)
OUTPATIENT PHYSICAL THERAPY CERVICAL Treatment   Patient Name: Alec Clark MRN: [id number] DOB:[birth year], 62 y.o., male Today's Date: 08/04/2023  END OF SESSION:  PT End of Session - 08/04/23 1054     Visit Number 7    Number of Visits 13    Date for PT Re-Evaluation 08/21/23    Authorization Time Period 07/10/23 to 08/21/23    Authorization - Number of Visits 30    PT Start Time 1009    PT Stop Time 1104    PT Time Calculation (min) 55 min    Activity Tolerance Patient tolerated treatment well    Behavior During Therapy [facility] for tasks assessed/performed             No past medical history on file. No past surgical history on file. Patient Active Problem List   Diagnosis Date Noted   Upper respiratory tract infection 01/28/2022   Sore throat 10/01/2021   Back pain with sciatica 10/01/2021   Pain of right thumb 05/07/2021   Annual physical exam 03/26/2021   Right shoulder pain 03/26/2021   Rash 03/26/2021   Armpit abscess 03/26/2021    PCP: Gabriel Earing, FNP  REFERRING PROVIDER: Gabriel Earing, FNP  REFERRING DIAG: M54.2 (ICD-10-CM) - Acute neck pain  THERAPY DIAG:  Cervicalgia  Cramp and spasm  Abnormal posture  Rationale for Evaluation and Treatment: Rehabilitation  ONSET DATE: 3 months ago (maybe even longer)  SUBJECTIVE:                                                                                                                                                                                                         SUBJECTIVE STATEMENT:  Had a headache since last treatment.  Not too bad right now but feels like it's coming back.  Overall, much better since starting PT. PERTINENT HISTORY:  No significant PMH/PSH   PAIN:  Are you having pain? Yes: NPRS scale: 5/10 Pain location: base of skull coming down into shoulder area, started on the L now spreading to R  Pain description: nagging ache  Aggravating factors: movements, looking  R or L or up or down Relieving factors: medications   PRECAUTIONS: None  RED FLAGS: None     WEIGHT BEARING RESTRICTIONS: No  FALLS:  Has patient fallen in last 6 months? 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  PATIENT SURVEYS:  FOTO will do second session   COGNITION: Overall cognitive status: Within functional limits for tasks assessed  SENSATION: WFL  POSTURE: rounded shoulders, forward head, increased thoracic kyphosis, and flexed trunk   PALPATION:  Tight and tender B subocciptials, cervical and thoracic paraspinals, upper traps    CERVICAL ROM:   Active ROM A/PROM (deg) eval  Flexion 41*  Extension 22*  Right lateral flexion 12*  Left lateral flexion 18*  Right rotation 75% limited   Left rotation 75% limited    (Blank rows = not tested)        TODAY'S TREATMENT:                                                                                                                              DATE:                                                08/04/23:     In prone with patient on plinth with head in neutral in face hole:  Performed dry to bilateral upper cervical musculature.   Combo e'stim/US at 1.50 W/CM2 x 12 minutes  (6 mins each side) Cerv. Paras  STW/M x 12 minutes to patient's bilateral cervical paraspinal musculature and suboccipital region.   HMP and IFC at 80-150 Hz on 40% scan x 15 minutes.  Normal modality response following removal of  modality.    PATIENT EDUCATION:  Education details: See below. Person educated: Patient Education method: Education comprehension: verbalized understanding, returned demonstration, and needs further education  HOME EXERCISE PROGRAM:  Chin tucks and extension and done with a towel as well.  ASSESSMENT:  CLINICAL IMPRESSION: Patient had headache over weekend.  He had a very good response to treatment today with lowered pain and no signs of headache.  We discussed again correct posture and HEP.    OBJECTIVE IMPAIRMENTS: decreased ROM, hypomobility, increased fascial restrictions, increased muscle spasms, impaired flexibility, postural dysfunction, and pain.   ACTIVITY LIMITATIONS: carrying, lifting, sleeping, and reach over head  PARTICIPATION LIMITATIONS: community activity, occupation, and yard work  PERSONAL FACTORS: Age, Behavior pattern, Education, Past/current experiences, Social background, and Time since onset of injury/illness/exacerbation are also affecting patient's functional outcome.   REHAB POTENTIAL: Excellent  CLINICAL DECISION MAKING: Stable/uncomplicated  EVALUATION COMPLEXITY: Low   GOALS: Goals reviewed with patient? Yes  SHORT TERM GOALS: Target date: 07/31/2023    Will be compliant with appropriate progressive HEP  Baseline:  Goal status: INITIAL  2.  Pain to be  no more than 3/10 at worst  Baseline:  Goal status: INITIAL  3.  Will demonstrate improved awareness of general posture with functional task performance  Baseline:  Goal status: INITIAL    LONG TERM GOALS: Target date: 08/21/2023    Cervical ROM to have normalized with no more than 10* restriction all planes of motion and no pain  Baseline:  Goal status: INITIAL  2.  Pain that had spread to thoracic and lumbar spines to have resolved  Baseline:  Goal status: INITIAL  3.  Muscle spasm and trigger points to have improved by at least 75% Baseline:  Goal status: INITIAL  4.  Will  be able to freely move neck as desired in order to drive and perform all required tasks at work pain free  Baseline:  Goal status: INITIAL  5.  Will report no further sleep interruptions due to neck pain  Baseline:  Goal status: INITIAL     PLAN:  PT FREQUENCY: 1-2x/week  PT DURATION: 6 weeks  PLANNED INTERVENTIONS: Therapeutic exercises, Therapeutic activity, Patient/Family education, Self Care, Joint mobilization, Dry Needling, Electrical stimulation, Cryotherapy, Moist heat, Taping, Ultrasound, Ionotophoresis 4mg /ml Dexamethasone, Manual therapy, and Re-evaluation  PLAN FOR NEXT SESSION: postural training/strengthening, MHP and other modalities as desired, , thoracic and lumbar ROM PRN to address pain in these areas, manual and DN   [place] Denard Tuminello MPT

## 2023-08-06 ENCOUNTER — Ambulatory Visit: Payer: BC Managed Care – PPO | Admitting: Family Medicine

## 2023-08-06 ENCOUNTER — Encounter: Payer: Self-pay | Admitting: Family Medicine

## 2023-08-06 VITALS — BP 123/78 | HR 62 | Temp 98.1°F | Ht 69.0 in | Wt 181.5 lb

## 2023-08-06 DIAGNOSIS — M545 Low back pain, unspecified: Secondary | ICD-10-CM | POA: Diagnosis not present

## 2023-08-06 DIAGNOSIS — G8929 Other chronic pain: Secondary | ICD-10-CM

## 2023-08-06 DIAGNOSIS — M542 Cervicalgia: Secondary | ICD-10-CM | POA: Diagnosis not present

## 2023-08-06 DIAGNOSIS — J0100 Acute maxillary sinusitis, unspecified: Secondary | ICD-10-CM

## 2023-08-06 MED ORDER — AMOXICILLIN-POT CLAVULANATE 875-125 MG PO TABS
1.0000 | ORAL_TABLET | Freq: Two times a day (BID) | ORAL | 0 refills | Status: AC
Start: 2023-08-06 — End: 2023-08-13

## 2023-08-06 NOTE — Progress Notes (Signed)
Established Patient Office Visit  Subjective   Patient ID: Alec Clark, male    DOB: 16-Mar-1961  Age: 62 y.o. MRN: [id number]  Chief Complaint  Patient presents with   Neck Pain   lumbar back pain    HPI Alec Clark is here for follow up of neck pain. This has been ongoing for 10-12 weeks.  He has completed 3 weeks of PT, he has 3 weeks left. This has helped some, but he continues to feel stiff and have pain with rotation. Pain sometimes triggers a migraine. PT has also been doing some dry needling with some relief. He has alos tried tylenol, mobic, stretching with mild relief. Denies numbness or tingling.   Hx of chronic lower back pain with sciatica. He has had increase pain for last 11 days after shoveling. He has been taking tylenol, mobic, stretching. Pain is intermittent, worse with activity, bending, twisting. Pain does not radiate. Denies numbness or tingling, changes in bowel or bladder control, saddle anesthesia. He is having spasms in his back.  Increase nasal congestion with facial pain for the last week, unchanged. Runny nose, sneezing, congestion. No fever, cough, sore throat. Take flonase and zyrtec without relief     ROS As per HPI.    Objective:     BP 123/78   Pulse 62   Temp 98.1 F (36.7 C) (Temporal)   Ht 5\' 9"  (1.753 m)   Wt 181 lb 8 oz (82.3 kg)   SpO2 95%   BMI 26.80 kg/m    Physical Exam Vitals and nursing note reviewed.  Constitutional:      General: He is not in acute distress.    Appearance: Normal appearance. He is not ill-appearing, toxic-appearing or diaphoretic.  HENT:     Right Ear: Tympanic membrane, ear canal and external ear normal.     Left Ear: Tympanic membrane, ear canal and external ear normal.     Nose: Congestion present.     Right Sinus: Maxillary sinus tenderness and frontal sinus tenderness present.     Left Sinus: Maxillary sinus tenderness present.  Eyes:     General:        Right eye: No discharge.        Left  eye: No discharge.  Cardiovascular:     Rate and Rhythm: Regular rhythm.     Pulses: Normal pulses.     Heart sounds: Normal heart sounds. No murmur heard. Pulmonary:     Effort: Pulmonary effort is normal. No respiratory distress.     Breath sounds: Normal breath sounds.  Musculoskeletal:     Cervical back: Normal range of motion and neck supple. No edema, erythema, signs of trauma, rigidity or crepitus. Pain with movement (rotation to left) and muscular tenderness present. No spinous process tenderness. Normal range of motion.     Lumbar back: Tenderness present. No swelling, edema, deformity or bony tenderness. Normal range of motion. Negative right straight leg raise test and negative left straight leg raise test.     Right lower leg: No edema.     Left lower leg: No edema.  Skin:    General: Skin is warm and dry.  Neurological:     General: No focal deficit present.     Mental Status: He is alert and oriented to person, place, and time.  Psychiatric:        Mood and Affect: Mood normal.        Behavior: Behavior normal.  Thought Content: Thought content normal.        Judgment: Judgment normal.    No results found for any visits on 08/06/23.    The ASCVD Risk score (Arnett DK, et al., 2019) failed to calculate for the following reasons:   The valid total cholesterol range is 130 to 320 mg/dL    Assessment & Plan:   Alec Clark was seen today for neck pain and lumbar back pain.  Diagnoses and all orders for this visit:  Acute neck pain Only mild improvement with PT. Complete PT. Discussed and placed referral to ortho. Continue tylenol, mobic, stretching.  -     Ambulatory referral to Orthopedic Surgery  Chronic bilateral low back pain without sciatica Uncontrolled, no red flags. Referral back to ortho. He declined PT today for his back but will notify me if he changes his mind. Robaxin prn for spasms. Tylenol, mobic, stretching.  -     Ambulatory referral to  Orthopedic Surgery  Acute non-recurrent maxillary sinusitis Discussed symptomatic care and return precautions.  -     amoxicillin-clavulanate (AUGMENTIN) 875-125 MG tablet; Take 1 tablet by mouth 2 (two) times daily for 7 days.   Return if symptoms worsen or fail to improve.   The patient indicates understanding of these issues and agrees with the plan.  Gabriel Earing, FNP

## 2023-08-07 ENCOUNTER — Ambulatory Visit: Payer: BC Managed Care – PPO | Admitting: Physical Therapy

## 2023-08-07 DIAGNOSIS — R293 Abnormal posture: Secondary | ICD-10-CM | POA: Diagnosis not present

## 2023-08-07 DIAGNOSIS — M542 Cervicalgia: Secondary | ICD-10-CM

## 2023-08-07 DIAGNOSIS — R252 Cramp and spasm: Secondary | ICD-10-CM | POA: Diagnosis not present

## 2023-08-07 NOTE — Therapy (Signed)
OUTPATIENT PHYSICAL THERAPY CERVICAL Treatment   Patient Name: Alec Clark MRN: [id number] DOB:1961/07/30, 62 y.o., male Today's Date: 08/07/2023  END OF SESSION:  PT End of Session - 08/07/23 1154     Visit Number 8    Number of Visits 13    Date for PT Re-Evaluation 08/21/23    Authorization Type [employer]    PT Start Time 1015    PT Stop Time 1111    PT Time Calculation (min) 56 min    Activity Tolerance Patient tolerated treatment well    Behavior During Therapy [facility] for tasks assessed/performed             No past medical history on file. No past surgical history on file. Patient Active Problem List   Diagnosis Date Noted   Upper respiratory tract infection 01/28/2022   Sore throat 10/01/2021   Back pain with sciatica 10/01/2021   Pain of right thumb 05/07/2021   Annual physical exam 03/26/2021   Right shoulder pain 03/26/2021   Rash 03/26/2021   Armpit abscess 03/26/2021    PCP: Gabriel Earing, FNP  REFERRING PROVIDER: Gabriel Earing, FNP  REFERRING DIAG: M54.2 (ICD-10-CM) - Acute neck pain  THERAPY DIAG:  Cervicalgia  Cramp and spasm  Abnormal posture  Rationale for Evaluation and Treatment: Rehabilitation  ONSET DATE: 3 months ago (maybe even longer)  SUBJECTIVE:                                                                                                                                                                                                         SUBJECTIVE STATEMENT:  Having a good day with low pain and no headache. PERTINENT HISTORY:  No significant PMH/PSH   PAIN:  Are you having pain? Yes: NPRS scale: 3/10 Pain location: base of skull coming down into shoulder area, started on the L now spreading to R  Pain description: nagging ache  Aggravating factors: movements, looking R or L or up or down Relieving factors: medications   PRECAUTIONS: None  RED FLAGS: None     WEIGHT BEARING RESTRICTIONS: No  FALLS:   Has patient fallen in last 6 months? 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  PATIENT SURVEYS:  FOTO will do second session   COGNITION: Overall cognitive status: Within functional limits for tasks assessed  SENSATION: WFL  POSTURE: rounded shoulders, forward head, increased thoracic kyphosis, and flexed trunk   PALPATION:  Tight and tender B subocciptials, cervical and thoracic paraspinals, upper traps    CERVICAL ROM:   Active ROM A/PROM (deg) eval  Flexion 41*  Extension 22*  Right lateral flexion 12*  Left lateral flexion 18*  Right rotation 75% limited   Left rotation 75% limited    (Blank rows = not tested)        TODAY'S TREATMENT:                                                                                                                              DATE:                                                08/07/23:     In prone with patient on plinth with head in neutral in face hole:  Performed dry to bilateral upper cervical musculature.   Combo e'stim/US at 1.50 W/CM2 x 12 minutes  (6 mins each side) Cerv. Paras  STW/M x 12 minutes to patient's bilateral cervical paraspinal musculature and suboccipital region.   HMP and IFC at 80-150 Hz on 40% scan x 20 minutes.  Normal modality response following removal of modality.    PATIENT EDUCATION:  Education details: See below. Person educated: Patient Education method: Education comprehension: verbalized  understanding, returned demonstration, and needs further education  HOME EXERCISE PROGRAM:  Chin tucks and extension and done with a towel as well.  ASSESSMENT:  CLINICAL IMPRESSION: Neck has been doing well since last visit.  No headache today and lowered pain-level.    OBJECTIVE IMPAIRMENTS: decreased ROM, hypomobility, increased fascial restrictions, increased muscle spasms, impaired flexibility, postural dysfunction, and pain.   ACTIVITY LIMITATIONS: carrying, lifting, sleeping, and reach over head  PARTICIPATION LIMITATIONS: community activity, occupation, and yard work  PERSONAL FACTORS: Age, Behavior pattern, Education, Past/current experiences, Social background, and Time since onset of injury/illness/exacerbation are also affecting patient's functional outcome.   REHAB POTENTIAL: Excellent  CLINICAL DECISION MAKING: Stable/uncomplicated  EVALUATION COMPLEXITY: Low   GOALS: Goals reviewed with patient? Yes  SHORT TERM GOALS: Target date: 07/31/2023    Will be compliant with appropriate progressive HEP  Baseline:  Goal status: INITIAL  2.  Pain to be no more than 3/10 at worst  Baseline:  Goal status: INITIAL  3.  Will demonstrate improved awareness of general posture with functional task performance  Baseline:  Goal status: INITIAL    LONG TERM GOALS: Target date: 08/21/2023  Cervical ROM to have normalized with no more than 10* restriction all planes of motion and no pain  Baseline:  Goal status: INITIAL  2.  Pain that had spread to thoracic and lumbar spines to have resolved  Baseline:  Goal status: INITIAL  3.  Muscle spasm and trigger points to have improved by at least 75% Baseline:  Goal status: INITIAL  4.  Will be able to freely move neck as desired in order to drive and perform all required tasks at work pain free  Baseline:  Goal status: INITIAL  5.  Will report no further sleep interruptions due to neck pain  Baseline:  Goal status:  INITIAL     PLAN:  PT FREQUENCY: 1-2x/week  PT DURATION: 6 weeks  PLANNED INTERVENTIONS: Therapeutic exercises, Therapeutic activity, Patient/Family education, Self Care, Joint mobilization, Dry Needling, Electrical stimulation, Cryotherapy, Moist heat, Taping, Ultrasound, Ionotophoresis 4mg /ml Dexamethasone, Manual therapy, and Re-evaluation  PLAN FOR NEXT SESSION: postural training/strengthening, MHP and other modalities as desired, , thoracic and lumbar ROM PRN to address pain in these areas, manual and DN   [place] Tammala Weider MPT

## 2023-08-11 ENCOUNTER — Ambulatory Visit: Payer: BC Managed Care – PPO | Admitting: Physical Therapy

## 2023-08-14 ENCOUNTER — Telehealth: Payer: Self-pay | Admitting: Family Medicine

## 2023-08-14 ENCOUNTER — Encounter: Payer: BC Managed Care – PPO | Admitting: Physical Therapy

## 2023-08-14 DIAGNOSIS — G8929 Other chronic pain: Secondary | ICD-10-CM

## 2023-08-14 MED ORDER — METHOCARBAMOL 750 MG PO TABS
750.0000 mg | ORAL_TABLET | Freq: Four times a day (QID) | ORAL | 1 refills | Status: DC
Start: 2023-08-14 — End: 2024-11-02

## 2023-08-14 NOTE — Telephone Encounter (Signed)
  Prescription Request  08/14/2023  Is this a "Controlled Substance" medicine? Not sure  Have you seen your PCP in the last 2 weeks? yes  If YES, route message to pool  -  If NO, patient needs to be scheduled for appointment.  What is the name of the medication or equipment? Methocarbam 500mg   Have you contacted your pharmacy to request a refill? yes   Which pharmacy would you like this sent to? [facility]   Patient notified that their request is being sent to the clinical staff for review and that they should receive a response within 2 business days.   TM's pt He was told to call, when he runs out & TM will send a refill. Per wife

## 2023-09-30 ENCOUNTER — Ambulatory Visit: Payer: BC Managed Care – PPO | Admitting: Family Medicine

## 2023-09-30 ENCOUNTER — Encounter: Payer: Self-pay | Admitting: Family Medicine

## 2023-09-30 VITALS — BP 133/67 | HR 71 | Temp 97.9°F | Ht 69.0 in | Wt 184.0 lb

## 2023-09-30 DIAGNOSIS — M5442 Lumbago with sciatica, left side: Secondary | ICD-10-CM

## 2023-09-30 DIAGNOSIS — G8929 Other chronic pain: Secondary | ICD-10-CM | POA: Diagnosis not present

## 2023-09-30 MED ORDER — KETOROLAC TROMETHAMINE 60 MG/2ML IM SOLN
60.0000 mg | Freq: Once | INTRAMUSCULAR | Status: AC
Start: 2023-09-30 — End: 2023-09-30
  Administered 2023-09-30: 60 mg via INTRAMUSCULAR

## 2023-09-30 MED ORDER — METHYLPREDNISOLONE ACETATE 80 MG/ML IJ SUSP
80.0000 mg | Freq: Once | INTRAMUSCULAR | Status: AC
Start: 2023-09-30 — End: 2023-09-30
  Administered 2023-09-30: 80 mg via INTRAMUSCULAR

## 2023-09-30 NOTE — Progress Notes (Signed)
   Acute Office Visit  Subjective:     Patient ID: Alec Clark, male    DOB: 1961/04/11, 62 y.o.   MRN: [id number]  Chief Complaint  Patient presents with   Sciatica    Back Pain This is a recurrent problem. Episode onset: flared up for 2 weeks. The problem occurs intermittently. The problem has been gradually worsening since onset. The pain is present in the lumbar spine. The quality of the pain is described as shooting and stabbing. The pain radiates to the left knee and left foot. The symptoms are aggravated by sitting, twisting, bending and lying down. Associated symptoms include leg pain (left posterior leg) and tingling (left foot). Pertinent negatives include no abdominal pain, bladder incontinence, bowel incontinence, dysuria, fever, numbness, paresthesias, pelvic pain, perianal numbness or weakness. He has tried NSAIDs, muscle relaxant, home exercises and analgesics for the symptoms. The treatment provided no relief.   He is established with ortho but stopped seeing them due to the cost.   Review of Systems  Constitutional:  Negative for fever.  Gastrointestinal:  Negative for abdominal pain and bowel incontinence.  Genitourinary:  Negative for bladder incontinence, dysuria and pelvic pain.  Musculoskeletal:  Positive for back pain.  Neurological:  Positive for tingling (left foot). Negative for weakness, numbness and paresthesias.        Objective:    BP 133/67   Pulse 71   Temp 97.9 F (36.6 C) (Temporal)   Ht 5\' 9"  (1.753 m)   Wt 184 lb (83.5 kg)   SpO2 95%   BMI 27.17 kg/m    Physical Exam Vitals and nursing note reviewed.  Constitutional:      General: He is not in acute distress.    Appearance: He is not ill-appearing, toxic-appearing or diaphoretic.  Pulmonary:     Effort: Pulmonary effort is normal. No respiratory distress.  Musculoskeletal:     Lumbar back: Tenderness (left gluteal) present. No swelling, edema, deformity, signs of trauma or bony  tenderness. Positive left straight leg raise test.     Right lower leg: No edema.     Left lower leg: No edema.  Skin:    General: Skin is warm and dry.  Neurological:     General: No focal deficit present.     Mental Status: He is alert and oriented to person, place, and time.     Motor: No weakness.     Gait: Gait normal.  Psychiatric:        Mood and Affect: Mood normal.        Behavior: Behavior normal.     No results found for any visits on 09/30/23.      Assessment & Plan:   Alec Clark was seen today for sciatica.  Diagnoses and all orders for this visit:  Chronic left-sided low back pain with left-sided sciatica Increase symptoms x 2 weeks. No red flag symptoms. Steroid and toradol IM today. Discussed to hold NSAIDs today and tomorrow, then resume. Continue massage, stretching. Discussed PT and/or referral to ortho if no improvement. Discussed when to seek emergency care.  -     methylPREDNISolone acetate (DEPO-MEDROL) injection 80 mg -     ketorolac (TORADOL) injection 60 mg   Return if symptoms worsen or fail to improve.  The patient indicates understanding of these issues and agrees with the plan.  Gabriel Earing, FNP

## 2023-10-20 ENCOUNTER — Ambulatory Visit: Payer: BC Managed Care – PPO | Admitting: Family Medicine

## 2023-10-20 ENCOUNTER — Encounter: Payer: Self-pay | Admitting: Family Medicine

## 2023-10-20 DIAGNOSIS — G8929 Other chronic pain: Secondary | ICD-10-CM

## 2023-10-20 DIAGNOSIS — M545 Low back pain, unspecified: Secondary | ICD-10-CM | POA: Diagnosis not present

## 2023-10-20 MED ORDER — KETOROLAC TROMETHAMINE 60 MG/2ML IM SOLN
60.0000 mg | Freq: Once | INTRAMUSCULAR | Status: AC
Start: 2023-10-20 — End: 2023-10-20
  Administered 2023-10-20: 60 mg via INTRAMUSCULAR

## 2023-10-20 NOTE — Patient Instructions (Addendum)
[facility] [place] [address] - [place] [address] [phone]   You can take methocarbamol (Robaxin) with NSAIDs such as meloxicam (Mobic), ibuprofen, OR naproxen. Can still take tyelnol

## 2023-10-20 NOTE — Progress Notes (Signed)
Subjective:  Patient ID: Alec Clark, male    DOB: 04-19-1961, 62 y.o.   MRN: [id number]  Patient Care Team: Gabriel Earing, FNP as PCP - General (Family Medicine)   Chief Complaint:  Back Pain (Sciatic nerve involvement shooting down left leg to left foot/Pins and needles and extreme numbness in left foot)  HPI: Alec Clark is a 62 y.o. male presenting on 10/20/2023 for Back Pain (Sciatic nerve involvement shooting down left leg to left foot/Pins and needles and extreme numbness in left foot) States that pain is the same as when he was here two week ago. Reports that the majority of pain is in his left hip and radiates down to his foot with "pins and needles". States that the pain in his is "throbbing." States that driving and sitting on hard surfaces causes pain to intensify. States that within 5 minutes of driving, his left foot is numb. Reports that he has been taking robaxin, but tries to avoid it due to drowsiness. He believed he was not allowed to take NSAIDs with robaxin, so he has been avoiding all NSAIDs. He is continuing to take tylenol. States that he has not been doing stretches. States that he was charged $700 per week for PT. He would like to go again, but does not want to be charged that. Reports that he has not been contacted by ortho. Denies Saddle anesthesia, incontinence, pelvic pain.   Relevant past medical, surgical, family, and social history reviewed and updated as indicated.  Allergies and medications reviewed and updated. Data reviewed: Chart in Epic.   History reviewed. No pertinent past medical history.  History reviewed. No pertinent surgical history.  Social History   Socioeconomic History   Marital status: Married    Spouse name: Not on file   Number of children: Not on file   Years of education: Not on file   Highest education level: Some college, no degree  Occupational History   Not on file  Tobacco Use   Smoking status: Former     Current packs/day: 1.00    Types: Cigarettes   Smokeless tobacco: Never  Vaping Use   Vaping status: Never Used  Substance and Sexual Activity   Alcohol use: Yes    Alcohol/week: 2.0 standard drinks of alcohol    Types: 2 Cans of beer per week   Drug use: Never   Sexual activity: Not on file  Other Topics Concern   Not on file  Social History Narrative   Not on file   Social Determinants of Health   Financial Resource Strain: Medium Risk (09/29/2023)   Overall Financial Resource Strain (CARDIA)    Difficulty of Paying Living Expenses: Somewhat hard  Food Insecurity: Food Insecurity Present (09/29/2023)   Hunger Vital Sign    Worried About Running Out of Food in the Last Year: Sometimes true    Ran Out of Food in the Last Year: Sometimes true  Transportation Needs: No Transportation Needs (09/29/2023)   PRAPARE - Administrator, Civil Service (Medical): No    Lack of Transportation (Non-Medical): No  Physical Activity: Insufficiently Active (09/29/2023)   Exercise Vital Sign    Days of Exercise per Week: 1 day    Minutes of Exercise per Session: 30 min  Stress: Stress Concern Present (09/29/2023)   [employer] of Occupational Health - Occupational Stress Questionnaire    Feeling of Stress : To some extent  Social Connections: Unknown (09/29/2023)   Social  Connection and Isolation Panel [NHANES]    Frequency of Communication with Friends and Family: Once a week    Frequency of Social Gatherings with Friends and Family: Once a week    Attends Religious Services: Patient declined    Database administrator or Organizations: No    Attends Engineer, structural: Not on file    Marital Status: Married  Intimate Partner Violence: Not on file    Outpatient Encounter Medications as of 10/20/2023  Medication Sig   acetaminophen (TYLENOL) 500 MG tablet Take 1 tablet (500 mg total) by mouth every 6 (six) hours as needed.   cetirizine (ZYRTEC) 10 MG tablet  Take 10 mg by mouth daily.   fluticasone (FLONASE) 50 MCG/ACT nasal spray Place 2 sprays into both nostrils daily.   meloxicam (MOBIC) 15 MG tablet Take 1 tablet (15 mg total) by mouth daily.   methocarbamol (ROBAXIN-750) 750 MG tablet Take 1 tablet (750 mg total) by mouth 4 (four) times daily.   rosuvastatin (CRESTOR) 20 MG tablet Take 1 tablet (20 mg total) by mouth daily.   traZODone (DESYREL) 50 MG tablet Take 0.5-1 tablets (25-50 mg total) by mouth at bedtime.   No facility-administered encounter medications on file as of 10/20/2023.    No Known Allergies  Review of Systems As per HPI Objective:  BP 116/73   Pulse 62   Temp 98.2 F (36.8 C)   Ht 5\' 9"  (1.753 m)   Wt 180 lb (81.6 kg)   SpO2 96%   BMI 26.58 kg/m    Wt Readings from Last 3 Encounters:  10/20/23 180 lb (81.6 kg)  09/30/23 184 lb (83.5 kg)  08/06/23 181 lb 8 oz (82.3 kg)    Physical Exam Constitutional:      General: He is awake. He is not in acute distress.    Appearance: Normal appearance. He is well-developed and well-groomed. He is not ill-appearing, toxic-appearing or diaphoretic.  Cardiovascular:     Rate and Rhythm: Normal rate and regular rhythm.     Pulses: Normal pulses.          Radial pulses are 2+ on the right side and 2+ on the left side.       Posterior tibial pulses are 2+ on the right side and 2+ on the left side.     Heart sounds: Normal heart sounds. No murmur heard.    No gallop.  Pulmonary:     Effort: Pulmonary effort is normal. No respiratory distress.     Breath sounds: Normal breath sounds. No stridor. No wheezing, rhonchi or rales.  Musculoskeletal:     Cervical back: Full passive range of motion without pain and neck supple.     Lumbar back: No swelling, edema, deformity, signs of trauma, lacerations, spasms, tenderness or bony tenderness. Normal range of motion. Negative right straight leg raise test and negative left straight leg raise test. No scoliosis.       Back:      Right lower leg: No edema.     Left lower leg: No edema.     Comments: Reports pain in left hip/buttock that radiates down.   Skin:    General: Skin is warm.     Capillary Refill: Capillary refill takes less than 2 seconds.  Neurological:     General: No focal deficit present.     Mental Status: He is alert, oriented to person, place, and time and easily aroused. Mental status is at baseline.  GCS: GCS eye subscore is 4. GCS verbal subscore is 5. GCS motor subscore is 6.     Motor: No weakness.  Psychiatric:        Attention and Perception: Attention and perception normal.        Mood and Affect: Mood and affect normal.        Speech: Speech normal.        Behavior: Behavior normal. Behavior is cooperative.        Thought Content: Thought content normal. Thought content does not include homicidal or suicidal ideation. Thought content does not include homicidal or suicidal plan.        Cognition and Memory: Cognition and memory normal.        Judgment: Judgment normal.     Results for orders placed or performed in visit on 05/29/23  CBC with Differential/Platelet  Result Value Ref Range   WBC 6.0 3.4 - 10.8 x10E3/uL   RBC 4.89 4.14 - 5.80 x10E6/uL   Hemoglobin 15.2 13.0 - 17.7 g/dL   Hematocrit 62.1 30.8 - 51.0 %   MCV 94 79 - 97 fL   MCH 31.1 26.6 - 33.0 pg   MCHC 33.2 31.5 - 35.7 g/dL   RDW 65.7 84.6 - 96.2 %   Platelets 248 150 - 450 x10E3/uL   Neutrophils 64 Not Estab. %   Lymphs 26 Not Estab. %   Monocytes 8 Not Estab. %   Eos 1 Not Estab. %   Basos 1 Not Estab. %   Neutrophils Absolute 3.9 1.4 - 7.0 x10E3/uL   Lymphocytes Absolute 1.6 0.7 - 3.1 x10E3/uL   Monocytes Absolute 0.5 0.1 - 0.9 x10E3/uL   EOS (ABSOLUTE) 0.1 0.0 - 0.4 x10E3/uL   Basophils Absolute 0.0 0.0 - 0.2 x10E3/uL   Immature Granulocytes 0 Not Estab. %   Immature Grans (Abs) 0.0 0.0 - 0.1 x10E3/uL  CMP14+EGFR  Result Value Ref Range   Glucose 99 70 - 99 mg/dL   BUN 14 8 - 27 mg/dL   Creatinine,  Ser 9.52 0.76 - 1.27 mg/dL   eGFR 75 >84 XL/KGM/0.10   BUN/Creatinine Ratio 13 10 - 24   Sodium 140 134 - 144 mmol/L   Potassium 4.7 3.5 - 5.2 mmol/L   Chloride 104 96 - 106 mmol/L   CO2 22 20 - 29 mmol/L   Calcium 9.4 8.6 - 10.2 mg/dL   Total Protein 6.9 6.0 - 8.5 g/dL   Albumin 4.6 3.9 - 4.9 g/dL   Globulin, Total 2.3 1.5 - 4.5 g/dL   Albumin/Globulin Ratio 2.0    Bilirubin Total 0.7 0.0 - 1.2 mg/dL   Alkaline Phosphatase 64 44 - 121 IU/L   AST 20 0 - 40 IU/L   ALT 21 0 - 44 IU/L  Vitamin B12  Result Value Ref Range   Vitamin B-12 221 (L) 232 - 1,245 pg/mL  PSA, total and free  Result Value Ref Range   Prostate Specific Ag, Serum 0.9 0.0 - 4.0 ng/mL   PSA, Free 0.15 N/A ng/mL   PSA, Free Pct 16.7 %  Lipase  Result Value Ref Range   Lipase 39 13 - 78 U/L       09/30/2023    9:46 AM 08/06/2023   11:12 AM 06/25/2023    9:33 AM 05/29/2023   10:28 AM 02/19/2023    8:27 AM  Depression screen PHQ 2/9  Decreased Interest 0 0  0 0  Down, Depressed, Hopeless 1 0 0 1 0  PHQ - 2 Score 1 0 0 1 0  Altered sleeping 1 1 2 2 1   Tired, decreased energy 1 1 2  0 1  Change in appetite 0 0 0 0 0  Feeling bad or failure about yourself  0 0 0 0 0  Trouble concentrating 0 0 0 0 0  Moving slowly or fidgety/restless 0 0 0 0 0  Suicidal thoughts 0 0 0 0 0  PHQ-9 Score 3 2 4 3 2   Difficult doing work/chores Not difficult at all Not difficult at all Somewhat difficult Somewhat difficult Not difficult at all       09/30/2023    9:45 AM 08/06/2023   11:12 AM 06/25/2023    9:33 AM 05/29/2023   10:28 AM  GAD 7 : Generalized Anxiety Score  Nervous, Anxious, on Edge 0 0 0 1  Control/stop worrying 1 0 1 1  Worry too much - different things 1 1 1 1   Trouble relaxing 1 1 2 1   Restless 0 0 0 1  Easily annoyed or irritable 1 0 1 1  Afraid - awful might happen 1 0 0 0  Total GAD 7 Score 5 2 5 6   Anxiety Difficulty Somewhat difficult Not difficult at all Not difficult at all Not difficult at all    Pertinent labs & imaging results that were available during my care of the patient were reviewed by me and considered in my medical decision making.  Assessment & Plan:  Aws was seen today for back pain.  Diagnoses and all orders for this visit:  Chronic midline low back pain without sciatica Will provide injection as below. Reviewed GFR from June 2024. Patient to refill Robaxin, if not available, will send in refill. Educated patient on stretches to complete at home. Patient to notify PCP if he would like to start PT. Discussed with patient that he can take NSAIDs with muscle relaxer.  -     ketorolac (TORADOL) injection 60 mg   Continue all other maintenance medications.  Follow up plan: Return if symptoms worsen or fail to improve.  Continue healthy lifestyle choices, including diet (rich in fruits, vegetables, and lean proteins, and low in salt and simple carbohydrates) and exercise (at least 30 minutes of moderate physical activity daily).  Written and verbal instructions provided   The above assessment and management plan was discussed with the patient. The patient verbalized understanding of and has agreed to the management plan. Patient is aware to call the clinic if they develop any new symptoms or if symptoms persist or worsen. Patient is aware when to return to the clinic for a follow-up visit. Patient educated on when it is appropriate to go to the emergency department.   Neale Burly, DNP-FNP [facility] [address] [place], [place] [address] [phone]

## 2023-12-02 ENCOUNTER — Encounter: Payer: BC Managed Care – PPO | Admitting: Family Medicine

## 2023-12-03 ENCOUNTER — Ambulatory Visit: Payer: BC Managed Care – PPO | Admitting: Nurse Practitioner

## 2023-12-03 VITALS — BP 139/87 | HR 68 | Temp 98.0°F | Ht 69.0 in | Wt 182.0 lb

## 2023-12-03 DIAGNOSIS — J0100 Acute maxillary sinusitis, unspecified: Secondary | ICD-10-CM | POA: Diagnosis not present

## 2023-12-03 DIAGNOSIS — M7918 Myalgia, other site: Secondary | ICD-10-CM

## 2023-12-03 MED ORDER — PREDNISONE 20 MG PO TABS
40.0000 mg | ORAL_TABLET | Freq: Every day | ORAL | 0 refills | Status: AC
Start: 2023-12-03 — End: 2023-12-08

## 2023-12-03 MED ORDER — AMOXICILLIN-POT CLAVULANATE 875-125 MG PO TABS: 1.0000 | ORAL_TABLET | Freq: Two times a day (BID) | ORAL | 0 refills | Status: DC

## 2023-12-03 NOTE — Progress Notes (Signed)
Subjective:    Patient ID: Alec Clark, male    DOB: November 19, 1961, 62 y.o.   MRN: [id number]   Chief Complaint: Sore Throat, Cough, and Arm Pain (Left arm/)   Sore Throat  This is a new problem. The current episode started in the past 7 days. The problem has been gradually worsening. Neither side of throat is experiencing more pain than the other. There has been no fever. The pain is at a severity of 6/10. The pain is moderate. Associated symptoms include congestion, coughing and trouble swallowing. Pertinent negatives include no shortness of breath. He has had no exposure to strep. He has tried nothing for the symptoms. The treatment provided no relief.  Arm Pain  There was no injury mechanism. The pain is present in the upper left arm. The quality of the pain is described as aching. The pain does not radiate. The pain is at a severity of 7/10. The pain is mild. The pain has been Intermittent since the incident. He has tried NSAIDs for the symptoms. The treatment provided mild relief.    Patient Active Problem List   Diagnosis Date Noted   Upper respiratory tract infection 01/28/2022   Sore throat 10/01/2021   Back pain with sciatica 10/01/2021   Pain of right thumb 05/07/2021   Annual physical exam 03/26/2021   Right shoulder pain 03/26/2021   Rash 03/26/2021   Armpit abscess 03/26/2021       Review of Systems  Constitutional:  Negative for chills and fever.  HENT:  Positive for congestion and trouble swallowing.   Respiratory:  Positive for cough. Negative for shortness of breath.        Objective:   Physical Exam Vitals reviewed.  Constitutional:      Appearance: He is well-developed.  HENT:     Right Ear: Tympanic membrane normal.     Left Ear: Tympanic membrane normal.     Nose: Congestion and rhinorrhea present.     Right Sinus: Maxillary sinus tenderness present.     Left Sinus: Maxillary sinus tenderness present.     Mouth/Throat:     Pharynx: Posterior  oropharyngeal erythema (mild) present.  Eyes:     Pupils: Pupils are equal, round, and reactive to light.  Cardiovascular:     Rate and Rhythm: Normal rate and regular rhythm.     Heart sounds: Normal heart sounds.  Pulmonary:     Effort: Pulmonary effort is normal.     Breath sounds: Normal breath sounds.  Musculoskeletal:     Cervical back: Normal range of motion and neck supple. No rigidity.     Comments: Pain left deltoid area on palpation  No edema- no nodules  Lymphadenopathy:     Cervical: No cervical adenopathy.  Skin:    General: Skin is warm.  Neurological:     General: No focal deficit present.     Mental Status: He is alert and oriented to person, place, and time.  Psychiatric:        Mood and Affect: Mood normal.        Behavior: Behavior normal.     BP 139/87   Pulse 68   Temp 98 F (36.7 C) (Temporal)   Ht 5\' 9"  (1.753 m)   Wt 182 lb (82.6 kg)   SpO2 95%   BMI 26.88 kg/m        Assessment & Plan:   Noris Hohman in today with chief complaint of Sore Throat, Cough, and Arm Pain (Left  arm/)   1. Acute non-recurrent maxillary sinusitis (Primary) 1. Take meds as prescribed 2. Use a cool mist humidifier especially during the winter months and when heat has been humid. 3. Use saline nose sprays frequently 4. Saline irrigations of the nose can be very helpful if done frequently.  * 4X daily for 1 week*  * Use of a nettie pot can be helpful with this. Follow directions with this* 5. Drink plenty of fluids 6. Keep thermostat turn down low 7.For any cough or congestion- mucinex OTC 8. For fever or aces or pains- take tylenol or ibuprofen appropriate for age and weight.  * for fevers greater than 101 orally you may alternate ibuprofen and tylenol every  3 hours.    - amoxicillin-clavulanate (AUGMENTIN) 875-125 MG tablet; Take 1 tablet by mouth 2 (two) times daily.  Dispense: 14 tablet; Refill: 0  2. Pain of left deltoid Moist heat Rest TENS unit  2-3x a day - predniSONE (DELTASONE) 20 MG tablet; Take 2 tablets (40 mg total) by mouth daily with breakfast for 5 days. 2 po daily for 5 days  Dispense: 10 tablet; Refill: 0    The above assessment and management plan was discussed with the patient. The patient verbalized understanding of and has agreed to the management plan. Patient is aware to call the clinic if symptoms persist or worsen. Patient is aware when to return to the clinic for a follow-up visit. Patient educated on when it is appropriate to go to the emergency department.   Mary-Margaret Daphine Deutscher, FNP

## 2023-12-03 NOTE — Patient Instructions (Addendum)

## 2024-01-25 ENCOUNTER — Telehealth: Payer: Self-pay | Admitting: Family Medicine

## 2024-01-25 DIAGNOSIS — Z20828 Contact with and (suspected) exposure to other viral communicable diseases: Secondary | ICD-10-CM

## 2024-01-25 NOTE — Telephone Encounter (Signed)
Copied from CRM [id number]. Topic: Clinical - Medication Refill >> Jan 25, 2024 10:58 AM Desma Mcgregor wrote: Most Recent Primary Care Visit:  Provider: Bennie Pierini  Department: [facility]  Visit Type: ACUTE  Date: 12/03/2023  Medication: Tamiflu. Lucila Maine has the flu and requesting this to prevent from getting it.  Has the patient contacted their pharmacy? No. Patient never had this medication filled  Is this the correct pharmacy for this prescription? Yes If no, delete pharmacy and type the correct one.  This is the patient's preferred pharmacy:  [facility] [address], [place] - [address] [place] [address] [place] [address] Phone: [phone] Fax: [phone]   Has the prescription been filled recently? No  Is the patient out of the medication? Yes  Has the patient been seen for an appointment in the last year OR does the patient have an upcoming appointment? Yes  Can we respond through MyChart? Yes  Agent: Please be advised that Rx refills may take up to 3 business days. We ask that you follow-up with your pharmacy.

## 2024-01-27 MED ORDER — OSELTAMIVIR PHOSPHATE 75 MG PO CAPS: 75.0000 mg | ORAL_CAPSULE | Freq: Two times a day (BID) | ORAL | 0 refills | Status: AC

## 2024-01-27 NOTE — Telephone Encounter (Signed)
LMOVM Tamiflu was sent to pharmacy

## 2024-01-27 NOTE — Telephone Encounter (Signed)
Tamiflu sent.

## 2024-03-22 ENCOUNTER — Encounter: Payer: Self-pay | Admitting: Nurse Practitioner

## 2024-03-22 ENCOUNTER — Ambulatory Visit: Admitting: Nurse Practitioner

## 2024-03-22 VITALS — BP 140/79 | HR 86 | Temp 98.2°F | Ht 69.0 in | Wt 177.0 lb

## 2024-03-22 DIAGNOSIS — J40 Bronchitis, not specified as acute or chronic: Secondary | ICD-10-CM

## 2024-03-22 DIAGNOSIS — R051 Acute cough: Secondary | ICD-10-CM | POA: Diagnosis not present

## 2024-03-22 LAB — VERITOR FLU A/B WAIVED
Influenza A: NEGATIVE
Influenza B: NEGATIVE

## 2024-03-22 MED ORDER — BENZONATATE 100 MG PO CAPS: 100.0000 mg | ORAL_CAPSULE | Freq: Three times a day (TID) | ORAL | 0 refills | Status: DC | PRN

## 2024-03-22 MED ORDER — AZITHROMYCIN 250 MG PO TABS: ORAL_TABLET | ORAL | 0 refills | Status: DC

## 2024-03-22 MED ORDER — PREDNISONE 20 MG PO TABS: 40.0000 mg | ORAL_TABLET | Freq: Every day | ORAL | 0 refills | Status: AC

## 2024-03-22 NOTE — Progress Notes (Signed)
 Subjective:    Patient ID: Alec Clark, male    DOB: [birth year], 63 y.o.   MRN: [id number]   Chief Complaint: Cough (Since Saturday/), Nasal Congestion, and Sinus Problem   Cough This is a new problem. The current episode started in the past 7 days. The problem has been gradually worsening. The problem occurs every few minutes. The cough is Productive of sputum. Associated symptoms include chills, ear congestion, nasal congestion, rhinorrhea and shortness of breath (occasionally). Pertinent negatives include no ear pain or fever. Nothing aggravates the symptoms. Treatments tried: sinex, zytrec, flonase and mucinex.  Sinus Problem Associated symptoms include chills, coughing and shortness of breath (occasionally). Pertinent negatives include no ear pain or sinus pressure.    Patient Active Problem List   Diagnosis Date Noted   Upper respiratory tract infection 01/28/2022   Sore throat 10/01/2021   Back pain with sciatica 10/01/2021   Pain of right thumb 05/07/2021   Annual physical exam 03/26/2021   Right shoulder pain 03/26/2021   Rash 03/26/2021   Armpit abscess 03/26/2021       Review of Systems  Constitutional:  Positive for chills. Negative for fever.  HENT:  Positive for rhinorrhea. Negative for ear pain and sinus pressure.   Respiratory:  Positive for cough and shortness of breath (occasionally).        Objective:   Physical Exam Constitutional:      Appearance: Normal appearance.  HENT:     Right Ear: Tympanic membrane normal.     Left Ear: Tympanic membrane normal.     Nose: Congestion and rhinorrhea present.     Mouth/Throat:     Pharynx: No oropharyngeal exudate or posterior oropharyngeal erythema.  Cardiovascular:     Rate and Rhythm: Normal rate and regular rhythm.     Heart sounds: Normal heart sounds.  Pulmonary:     Effort: Pulmonary effort is normal.     Breath sounds: Wheezes: faint exp wheezes throughout.     Comments: Deep we  cough Musculoskeletal:     Cervical back: Normal range of motion.  Skin:    General: Skin is warm.  Neurological:     General: No focal deficit present.     Mental Status: He is alert and oriented to person, place, and time.  Psychiatric:        Mood and Affect: Mood normal.        Behavior: Behavior normal.    Flu negative   BP (!) 140/79   Pulse 86   Temp 98.2 F (36.8 C) (Temporal)   Ht 5\' 9"  (1.753 m)   Wt 177 lb (80.3 kg)   SpO2 94%   BMI 26.14 kg/m      Assessment & Plan:   Alec Clark in today with chief complaint of Cough (Since Saturday/), Nasal Congestion, and Sinus Problem   1. Acute cough (Primary)  - Veritor Flu A/B Waived  2. Bronchitis 1. Take meds as prescribed 2. Use a cool mist humidifier especially during the winter months and when heat has been humid. 3. Use saline nose sprays frequently 4. Saline irrigations of the nose can be very helpful if done frequently.  * 4X daily for 1 week*  * Use of a nettie pot can be helpful with this. Follow directions with this* 5. Drink plenty of fluids 6. Keep thermostat turn down low 7.For any cough or congestion- tessalon perles 8. For fever or aces or pains- take tylenol or ibuprofen appropriate for age and  weight.  * for fevers greater than 101 orally you may alternate ibuprofen and tylenol every  3 hours.    - azithromycin (ZITHROMAX Z-PAK) 250 MG tablet; As directed  Dispense: 6 tablet; Refill: 0 - predniSONE (DELTASONE) 20 MG tablet; Take 2 tablets (40 mg total) by mouth daily with breakfast for 5 days. 2 po daily for 5 days  Dispense: 10 tablet; Refill: 0 - benzonatate (TESSALON PERLES) 100 MG capsule; Take 1 capsule (100 mg total) by mouth 3 (three) times daily as needed for cough.  Dispense: 20 capsule; Refill: 0    The above assessment and management plan was discussed with the patient. The patient verbalized understanding of and has agreed to the management plan. Patient is aware to call the  clinic if symptoms persist or worsen. Patient is aware when to return to the clinic for a follow-up visit. Patient educated on when it is appropriate to go to the emergency department.   Mary-Margaret Daphine Deutscher, FNP

## 2024-03-22 NOTE — Patient Instructions (Signed)

## 2024-03-23 ENCOUNTER — Ambulatory Visit: Payer: Self-pay

## 2024-03-23 NOTE — Telephone Encounter (Signed)
 Copied From CRM [id number]. Reason for Triage: The patient has tested positive for COVID 19 via an at home test and is uncertain if the medication they've recently been prescribed medicine for an upper respiratory infection is still safe to take  Please contact further when possible   Wanted to make sure that he could still take the medication he was prescribed yesterday.  This Rn able to answer patient's question.  States he is now covid positive and will need a new note for work.  States his wife will go to the office.

## 2024-03-23 NOTE — Telephone Encounter (Signed)
 Ok to take meds prescribed. Ok to return to work 5 days after start of symptoms.

## 2024-03-23 NOTE — Telephone Encounter (Signed)
 Pt aware of provider feedback and new letter written and put up front for pt to pick up.

## 2024-06-09 ENCOUNTER — Ambulatory Visit: Admitting: Family Medicine

## 2024-06-09 ENCOUNTER — Encounter: Payer: Self-pay | Admitting: Family Medicine

## 2024-06-09 VITALS — BP 138/79 | HR 58 | Temp 97.8°F | Ht 69.0 in

## 2024-06-09 DIAGNOSIS — G8929 Other chronic pain: Secondary | ICD-10-CM

## 2024-06-09 DIAGNOSIS — M79672 Pain in left foot: Secondary | ICD-10-CM

## 2024-06-09 DIAGNOSIS — M79671 Pain in right foot: Secondary | ICD-10-CM | POA: Diagnosis not present

## 2024-06-09 DIAGNOSIS — M542 Cervicalgia: Secondary | ICD-10-CM

## 2024-06-09 DIAGNOSIS — M545 Low back pain, unspecified: Secondary | ICD-10-CM | POA: Diagnosis not present

## 2024-06-09 MED ORDER — MELOXICAM 15 MG PO TABS: 15.0000 mg | ORAL_TABLET | Freq: Every day | ORAL | 2 refills | Status: DC

## 2024-06-09 NOTE — Progress Notes (Signed)
   Acute Office Visit  Subjective:     Patient ID: Alec Clark, male    DOB: 1961/09/20, 63 y.o.   MRN: [id number]  Chief Complaint  Patient presents with   Foot Pain    HPI Patient is in today for bilateral foot pain. This has been ongoing for years. Pain is gradually worsening. Has pain in heels and along the bottom of both feet. Also has pain in bilateral medial ankles that radiates up medial shin into his knees, especially at night. Both ankles swell intermittently. Pain is worse with activity. Works on [employer] as a second language teacher. Has to wear steel toed shoes. Has flat feet. He has tried numerous insoles, stretching, meloxicam . Without relief. No injury. Requesting referral.  Requesting refill of meloxicam  for chronic back and neck pain. Pain has been stable, no new symptoms. Meloxicam  provides some relief.   ROS As per HPI.     Objective:    BP 138/79   Pulse (!) 58   Temp 97.8 F (36.6 C) (Temporal)   Ht 5' 9 (1.753 m)   SpO2 97%   BMI 26.14 kg/m    Physical Exam Vitals and nursing note reviewed.  Constitutional:      General: He is not in acute distress.    Appearance: He is not ill-appearing, toxic-appearing or diaphoretic.   Musculoskeletal:     Right lower leg: No edema.     Left lower leg: No edema.     Right ankle: Normal.     Right Achilles Tendon: Normal.     Left ankle: Normal.     Left Achilles Tendon: Normal.     Right foot: Normal.     Left foot: Normal.   Skin:    General: Skin is warm and dry.   Neurological:     General: No focal deficit present.     Mental Status: He is alert and oriented to person, place, and time.   Psychiatric:        Mood and Affect: Mood normal.        Behavior: Behavior normal.     No results found for any visits on 06/09/24.      Assessment & Plan:   Cauy was seen today for foot pain.  Diagnoses and all orders for this visit:  Chronic pain of both feet Referral discussed and placed. Continue support  shoes, stretching, meloxicam  prn.  -     Ambulatory referral to Orthopedic Surgery -     meloxicam  (MOBIC ) 15 MG tablet; Take 1 tablet (15 mg total) by mouth daily.  Chronic midline low back pain without sciatica Chronic neck pain Continue mobic  prn.  -     meloxicam  (MOBIC ) 15 MG tablet; Take 1 tablet (15 mg total) by mouth daily.  Return to office for new or worsening symptoms, or if symptoms persist.   The patient indicates understanding of these issues and agrees with the plan.  Annabella CHRISTELLA Search, FNP

## 2024-06-23 ENCOUNTER — Telehealth: Payer: Self-pay | Admitting: Family Medicine

## 2024-06-23 NOTE — Telephone Encounter (Signed)
 Pt informed via detailed vm. LS

## 2024-06-23 NOTE — Telephone Encounter (Signed)
 Referral sent to [facility] as Patient requested.

## 2024-06-23 NOTE — Telephone Encounter (Unsigned)
 Copied from CRM [id number]. Topic: Referral - Status >> Jun 23, 2024 10:43 AM Emylou G wrote: Reason for CRM: Patient called.. checking status of referral for his feet.

## 2024-06-27 DIAGNOSIS — M6701 Short Achilles tendon (acquired), right ankle: Secondary | ICD-10-CM | POA: Diagnosis not present

## 2024-06-27 DIAGNOSIS — M6702 Short Achilles tendon (acquired), left ankle: Secondary | ICD-10-CM | POA: Diagnosis not present

## 2024-06-27 DIAGNOSIS — M2141 Flat foot [pes planus] (acquired), right foot: Secondary | ICD-10-CM | POA: Diagnosis not present

## 2024-06-27 DIAGNOSIS — M2142 Flat foot [pes planus] (acquired), left foot: Secondary | ICD-10-CM | POA: Diagnosis not present

## 2024-06-27 DIAGNOSIS — M722 Plantar fascial fibromatosis: Secondary | ICD-10-CM | POA: Diagnosis not present

## 2024-09-21 ENCOUNTER — Ambulatory Visit: Admitting: Family Medicine

## 2024-09-21 ENCOUNTER — Encounter: Payer: Self-pay | Admitting: Family Medicine

## 2024-09-21 VITALS — BP 130/81 | HR 66 | Temp 98.1°F | Ht 69.0 in | Wt 167.0 lb

## 2024-09-21 DIAGNOSIS — F419 Anxiety disorder, unspecified: Secondary | ICD-10-CM | POA: Diagnosis not present

## 2024-09-21 DIAGNOSIS — M79671 Pain in right foot: Secondary | ICD-10-CM

## 2024-09-21 DIAGNOSIS — M79672 Pain in left foot: Secondary | ICD-10-CM

## 2024-09-21 DIAGNOSIS — F321 Major depressive disorder, single episode, moderate: Secondary | ICD-10-CM | POA: Diagnosis not present

## 2024-09-21 DIAGNOSIS — J0140 Acute pansinusitis, unspecified: Secondary | ICD-10-CM

## 2024-09-21 DIAGNOSIS — G8929 Other chronic pain: Secondary | ICD-10-CM

## 2024-09-21 MED ORDER — CITALOPRAM HYDROBROMIDE 10 MG PO TABS: 10.0000 mg | ORAL_TABLET | Freq: Every day | ORAL | 3 refills | Status: DC

## 2024-09-21 MED ORDER — AMOXICILLIN-POT CLAVULANATE 875-125 MG PO TABS: 1.0000 | ORAL_TABLET | Freq: Two times a day (BID) | ORAL | 0 refills | Status: AC

## 2024-09-21 NOTE — Progress Notes (Signed)
 Acute Office Visit  Subjective:     Patient ID: Alec Clark, male    DOB: [birth year], 63 y.o.   MRN: [id number]  Chief Complaint  Patient presents with   Facial Pain    HPI  History of Present Illness   Alec Clark is a 63 year old male who presents with sinus issues and foot pain.  Upper respiratory symptoms - Sinus symptoms for approximately one week - Headaches located at the occipital region - Facial pressure around cheeks, across eyes, and forehead - Rhinorrhea and mild sore throat - No otalgia - Use of over-the-counter Flonase  and Zyrtec with ongoing symptoms - Environmental modification by keeping windows closed to reduce allergen exposure - Increased sinus symptoms [place] residence  Chronic foot, knee, hip, and back pain - Foot pain present for three years, progressively worsening - Pain described as severe, exacerbated by prolonged standing on concrete floors at work - Medial knee pain, nocturnal throbbing in bones and muscles, lateral hip discomfort, and back pain - Orthopedic consultation recommended therapy, but unable to pursue due to insurance limitations - Trial of Tylenol , Icy Hot, and aspirin without symptomatic relief - Interest in orthotics for symptom management  Psychological symptoms - Anxiety and moderate depressive symptoms, worsened by family health and occupational stressors - Sleep disturbance, irritability, and persistent feelings of being overwhelmed - No prior use of pharmacologic therapy for anxiety or depression - Symptoms have been present for an extended period, as observed by spouse, but not previously discussed with a healthcare provider          09/21/2024   11:34 AM 06/09/2024    8:45 AM 03/22/2024    2:47 PM  Depression screen PHQ 2/9  Decreased Interest 1 1 1   Down, Depressed, Hopeless 2 0 0  PHQ - 2 Score 3 1 1   Altered sleeping 3 1   Tired, decreased energy 3  1   Change in appetite 0 0   Feeling bad or failure about yourself  0 0   Trouble concentrating 0 0   Moving slowly or fidgety/restless 2 0   Suicidal thoughts 0 0   PHQ-9 Score 11 3   Difficult doing work/chores Somewhat difficult Not difficult at all       09/21/2024   11:35 AM 06/09/2024    8:46 AM 10/20/2023    8:16 AM 09/30/2023    9:45 AM  GAD 7 : Generalized Anxiety Score  Nervous, Anxious, on Edge 1 1 0 0  Control/stop worrying 1 1 0 1  Worry too much - different things 1 1 0 1  Trouble relaxing 2 1 1 1   Restless 1 0 0 0  Easily annoyed or irritable 1 1 1 1   Afraid - awful might happen 1 1 0 1  Total GAD 7 Score 8 6 2 5   Anxiety Difficulty Somewhat difficult Not difficult at all Somewhat difficult Somewhat difficult     ROS As per HPI.      Objective:    BP 130/81   Pulse 66   Temp 98.1 F (36.7 C) (Temporal)   Ht 5' 9 (1.753 m)   Wt 167 lb (75.8 kg)   SpO2 95%   BMI 24.66 kg/m    Physical Exam Vitals and nursing note reviewed.  Constitutional:      General: He is not in acute distress.    Appearance: Normal appearance. He is not ill-appearing, toxic-appearing or  diaphoretic.  HENT:     Head: Normocephalic and atraumatic.     Right Ear: Tympanic membrane, ear canal and external ear normal.     Left Ear: Tympanic membrane, ear canal and external ear normal.     Nose: Congestion present.     Right Sinus: Maxillary sinus tenderness and frontal sinus tenderness present.     Left Sinus: Maxillary sinus tenderness and frontal sinus tenderness present.     Mouth/Throat:     Pharynx: Uvula midline. Posterior oropharyngeal erythema present. No pharyngeal swelling, oropharyngeal exudate, uvula swelling or postnasal drip.     Tonsils: No tonsillar exudate. 1+ on the right. 1+ on the left.  Cardiovascular:     Rate and Rhythm: Normal rate and regular rhythm.     Pulses: Normal pulses.     Heart sounds: Normal heart sounds. No murmur heard. Pulmonary:      Effort: Pulmonary effort is normal. No respiratory distress.     Breath sounds: Normal breath sounds.  Abdominal:     General: Bowel sounds are normal. There is no distension.     Palpations: Abdomen is soft. There is no mass.     Tenderness: There is no abdominal tenderness. There is no guarding or rebound.  Musculoskeletal:     Cervical back: Neck supple. No tenderness.     Right lower leg: No edema.     Left lower leg: No edema.  Lymphadenopathy:     Cervical: No cervical adenopathy.  Skin:    General: Skin is warm and dry.  Neurological:     General: No focal deficit present.     Mental Status: He is alert and oriented to person, place, and time.  Psychiatric:        Mood and Affect: Mood normal.        Behavior: Behavior normal.     No results found for any visits on 09/21/24.      Assessment & Plan:   Alec Clark was seen today for facial pain.  Diagnoses and all orders for this visit:  Acute non-recurrent pansinusitis -     amoxicillin -clavulanate (AUGMENTIN ) 875-125 MG tablet; Take 1 tablet by mouth 2 (two) times daily for 7 days.  Chronic pain of both feet -     Ambulatory referral to Podiatry  Depression, major, single episode, moderate (HCC)  Anxiety -     citalopram (CELEXA) 10 MG tablet; Take 1 tablet (10 mg total) by mouth daily.      Acute sinusitis Acute sinusitis with symptoms persisting for a week, likely exacerbated by environmental allergens. - Prescribe Augmentin .  Chronic bilateral foot pain He is seeking orthotics for foot pain management. - Refer to podiatry   Generalized anxiety disorder and major depressive disorder Generalized anxiety disorder and major depressive disorder with irritability, insomnia, and inability to unwind. Symptoms worsened by stressors. Informed about SSRI options and potential side effects. - Prescribe citalopram 10 mg daily. - Schedule follow-up in six weeks to assess response. - Advise to report major side  effects, including increased suicidal thoughts, immediately.      Return in about 6 weeks (around 11/02/2024) for medication follow up.  Annabella CHRISTELLA Search, FNP

## 2024-10-04 ENCOUNTER — Ambulatory Visit (INDEPENDENT_AMBULATORY_CARE_PROVIDER_SITE_OTHER)

## 2024-10-04 ENCOUNTER — Ambulatory Visit

## 2024-10-04 DIAGNOSIS — M216X2 Other acquired deformities of left foot: Secondary | ICD-10-CM

## 2024-10-04 DIAGNOSIS — M76821 Posterior tibial tendinitis, right leg: Secondary | ICD-10-CM | POA: Diagnosis not present

## 2024-10-04 DIAGNOSIS — M216X1 Other acquired deformities of right foot: Secondary | ICD-10-CM

## 2024-10-04 DIAGNOSIS — M2142 Flat foot [pes planus] (acquired), left foot: Secondary | ICD-10-CM

## 2024-10-04 DIAGNOSIS — M7751 Other enthesopathy of right foot: Secondary | ICD-10-CM

## 2024-10-04 DIAGNOSIS — M7752 Other enthesopathy of left foot: Secondary | ICD-10-CM | POA: Diagnosis not present

## 2024-10-04 DIAGNOSIS — M2141 Flat foot [pes planus] (acquired), right foot: Secondary | ICD-10-CM

## 2024-10-04 DIAGNOSIS — M76822 Posterior tibial tendinitis, left leg: Secondary | ICD-10-CM

## 2024-10-04 NOTE — Progress Notes (Unsigned)
 Subjective:  Patient ID: Alec Clark, male    DOB: 1961-06-03,  MRN: [id number]  Chief Complaint  Patient presents with   Foot Pain    Rm11 Bilateral heel pain that radiates to ankles for several months/shoe gear changes with no improvement    Discussed the use of AI scribe software for clinical note transcription with the patient, who gave verbal consent to proceed.  History of Present Illness Alec Clark is a 63 year old male who presents with foot pain and swelling. Pain occasionally is radiating to the hip.  He experiences significant foot pain, particularly in the arches, after standing for about two hours. The pain has worsened over time, spreading to his ankles and causing swelling by the end of the week. The pain radiates to his hips, leading to extreme discomfort and difficulty walking. The pain is more pronounced on the right side. He experiences muscle pain and aching in his legs at night, disrupting his sleep.  He was previously diagnosed with plantar fasciitis. He uses [facility] for relief, but the throbbing pain persists throughout the night.  He works long hours on concrete floors, which he believes exacerbates his symptoms. He has tried various shoes and OTC insoles, without relief. New boots initially feel comfortable but lead to limping and discomfort after prolonged use. He performs stretches for plantar fasciitis and Achilles tendon regularly, but continues to experience significant pain, affecting his daily activities and quality of life.      Objective:    Constitutional Well developed. Well nourished. Oriented to person, place, and time.  Vascular Dorsalis pedis pulses palpable bilaterally. Posterior tibial pulses palpable bilaterally. Capillary refill normal to all digits.  No cyanosis or clubbing noted. Pedal hair growth normal.  Neurologic Normal speech. Epicritic sensation to light touch grossly present bilaterally. Negative tinel sign at tarsal  tunnel bilaterally.   Dermatologic Skin texture and turgor are within normal limits.  No open wounds No skin lesions.  Musculoskeletal: 5 out of 5 muscle strength to all major pedal muscle groups.  Pes planus foot shape with collapse of medial longitudinal arch upon standing.  Reducible hindfoot revealing forefoot varus.  Pain to palpation of sinus tarsi, distal posterior tibial tendon.  Decreased ankle joint dorsiflexion with knee extended, normal with knee flexed.  Findings are bilateral, right slightly worse than left   Radiographs: Taken and reviewed.  3 views of bilateral feet were taken today.  These do show a pes planus foot deformity bilaterally with increased talonavicular joint uncoverage.  Elevated first ray.  No significant arthropathy, joint spaces appear well-maintained.  No acute osseous findings such as fracture or dislocation.   Assessment:   1. Pes planus of both feet   2. Acquired equinus deformity of both feet   3. Posterior tibialis tendinitis of both lower extremities   4. Capsulitis of metatarsophalangeal (MTP) joint of left foot   5. Capsulitis of metatarsophalangeal (MTP) joint of right foot      Plan:  Patient was evaluated and treated and all questions answered.  Assessment and Plan Assessment & Plan Bilateral posterior tibial tendon dysfunction with associated foot and ankle pain Chronic dysfunction with pain extending from arches to hips, exacerbated by standing. No tendon rupture, strength intact. Mild to moderate flat foot and repetitive stress likely causes. Pain affects quality of life. - Patient has attempted multiple OTC insoles. He is interested in custom orthotics to help support his arch. I think this is reasonable.  - Discussed recommendation for custom  molded orthotics to best match patient's anatomy and hold foot in a corrected position.  This is necessary due to posterior tibial tendon dysfunction that is causing a decrease in medial longitudinal  arch.   - Refer to Trish, our pedorthist for custom orthotic fitting. - Continue stretching exercises for plantar fasciitis and Achilles tendon. - Consider MRI if symptoms persist to evaluate soft tissue integrity and as part of possible surgical workup.  - Follow up in four weeks post-orthotics to assess progress.   Prentice Ovens, DPM AACFAS Fellowship Trained Podiatric Surgeon [facility]

## 2024-10-13 ENCOUNTER — Ambulatory Visit (INDEPENDENT_AMBULATORY_CARE_PROVIDER_SITE_OTHER)

## 2024-10-13 DIAGNOSIS — M2142 Flat foot [pes planus] (acquired), left foot: Secondary | ICD-10-CM | POA: Diagnosis not present

## 2024-10-13 DIAGNOSIS — M216X1 Other acquired deformities of right foot: Secondary | ICD-10-CM

## 2024-10-13 DIAGNOSIS — M216X2 Other acquired deformities of left foot: Secondary | ICD-10-CM

## 2024-10-13 DIAGNOSIS — M2141 Flat foot [pes planus] (acquired), right foot: Secondary | ICD-10-CM | POA: Diagnosis not present

## 2024-10-13 DIAGNOSIS — M7751 Other enthesopathy of right foot: Secondary | ICD-10-CM

## 2024-10-13 DIAGNOSIS — M76821 Posterior tibial tendinitis, right leg: Secondary | ICD-10-CM

## 2024-10-13 DIAGNOSIS — M7752 Other enthesopathy of left foot: Secondary | ICD-10-CM

## 2024-10-13 DIAGNOSIS — M76822 Posterior tibial tendinitis, left leg: Secondary | ICD-10-CM

## 2024-10-13 NOTE — Progress Notes (Signed)
 Patient presents today to be casted for custom molded orthotics. Dr. Prentice Ovens has been treating patient for Pes Planus, Acquired Equinus Deformity, and Capulitis.   Impression foam cast was taken. ABN signed.  Patient info-  Shoe size: 10  Shoe style: boot  Height: 5'9  Weight: 175   Insurance: [employer]   Patient will be notified once orthotics arrive in office and reappoint for fitting at that time.

## 2024-11-02 ENCOUNTER — Ambulatory Visit: Payer: Self-pay | Admitting: Family Medicine

## 2024-11-02 ENCOUNTER — Encounter: Payer: Self-pay | Admitting: Family Medicine

## 2024-11-02 VITALS — BP 131/76 | HR 65 | Temp 97.6°F | Ht 69.0 in | Wt 171.8 lb

## 2024-11-02 DIAGNOSIS — F321 Major depressive disorder, single episode, moderate: Secondary | ICD-10-CM

## 2024-11-02 DIAGNOSIS — R6882 Decreased libido: Secondary | ICD-10-CM

## 2024-11-02 DIAGNOSIS — F419 Anxiety disorder, unspecified: Secondary | ICD-10-CM | POA: Diagnosis not present

## 2024-11-02 DIAGNOSIS — M79671 Pain in right foot: Secondary | ICD-10-CM

## 2024-11-02 DIAGNOSIS — F5104 Psychophysiologic insomnia: Secondary | ICD-10-CM

## 2024-11-02 DIAGNOSIS — N529 Male erectile dysfunction, unspecified: Secondary | ICD-10-CM

## 2024-11-02 DIAGNOSIS — M79672 Pain in left foot: Secondary | ICD-10-CM

## 2024-11-02 DIAGNOSIS — M545 Low back pain, unspecified: Secondary | ICD-10-CM | POA: Diagnosis not present

## 2024-11-02 DIAGNOSIS — G8929 Other chronic pain: Secondary | ICD-10-CM

## 2024-11-02 DIAGNOSIS — M542 Cervicalgia: Secondary | ICD-10-CM

## 2024-11-02 MED ORDER — METHOCARBAMOL 750 MG PO TABS: 750.0000 mg | ORAL_TABLET | Freq: Four times a day (QID) | ORAL | 1 refills | Status: AC

## 2024-11-02 MED ORDER — TRAZODONE HCL 50 MG PO TABS: 25.0000 mg | ORAL_TABLET | Freq: Every day | ORAL | 1 refills | Status: AC

## 2024-11-02 MED ORDER — MELOXICAM 15 MG PO TABS: 15.0000 mg | ORAL_TABLET | Freq: Every day | ORAL | 2 refills | Status: AC

## 2024-11-02 MED ORDER — SERTRALINE HCL 25 MG PO TABS: 25.0000 mg | ORAL_TABLET | Freq: Every day | ORAL | 3 refills | Status: AC

## 2024-11-02 NOTE — Progress Notes (Signed)
 Acute Office Visit  Subjective:     Patient ID: Alec Clark, male    DOB: [birth year], 63 y.o.   MRN: [id number]  Chief Complaint  Patient presents with   Medical Management of Chronic Issues    HPI  History of Present Illness   Alec Clark is a 63 year old male who presents with anxiety and low sex drive.  Anxiety and mood disturbance - Anxiety with associated irritability and intermittent sleep disturbances - Increased grogginess and irritability while taking Celexa  for three weeks without improvement - Hesitant to try new medications due to concerns about side effects and a general preference to avoid medication when possible, though he would like to try another medication - Work-related stress contributes to anxiety; works second shift from 2 PM to 10 PM  Sleep disturbance - Intermittent sleep disturbances attributed to anxiety and work-related stress - Taking trazodone  with some relief  Decreased libido, ED - Significant decrease in sex drive and ED, associated with anxiety and possible low testosterone levels - Previous trials of Cialis and Viagra with limited success - History of low testosterone levels noted in the past prior to move to [place] - No urinary symptoms  - Requesting referral to urology            11/02/2024   10:54 AM 09/21/2024   11:34 AM 06/09/2024    8:45 AM  Depression screen PHQ 2/9  Decreased Interest 1 1 1   Down, Depressed, Hopeless 1 2 0  PHQ - 2 Score 2 3 1   Altered sleeping 2 3 1   Tired, decreased energy 2 3 1   Change in appetite 0 0 0  Feeling bad or failure about yourself  0 0 0  Trouble concentrating 0 0 0  Moving slowly or fidgety/restless 0 2 0  Suicidal thoughts 0 0 0  PHQ-9 Score 6 11  3    Difficult doing work/chores Not difficult at all Somewhat difficult Not difficult at all     Data saved with a previous flowsheet row definition      11/02/2024   10:55 AM 09/21/2024   11:35 AM 06/09/2024    8:46 AM 10/20/2023     8:16 AM  GAD 7 : Generalized Anxiety Score  Nervous, Anxious, on Edge 1 1 1  0  Control/stop worrying 1 1 1  0  Worry too much - different things 1 1 1  0  Trouble relaxing 1 2 1 1   Restless 0 1 0 0  Easily annoyed or irritable 1 1 1 1   Afraid - awful might happen 0 1 1 0  Total GAD 7 Score 5 8 6 2   Anxiety Difficulty Somewhat difficult Somewhat difficult Not difficult at all Somewhat difficult     ROS As per HPI.      Objective:    BP 131/76   Pulse 65   Temp 97.6 F (36.4 C) (Temporal)   Ht 5' 9 (1.753 m)   Wt 171 lb 12.8 oz (77.9 kg)   SpO2 98%   BMI 25.37 kg/m    Physical Exam Vitals and nursing note reviewed.  Constitutional:      General: He is not in acute distress.    Appearance: Normal appearance. He is not ill-appearing, toxic-appearing or diaphoretic.  Pulmonary:     Effort: Pulmonary effort is normal. No respiratory distress.     Breath sounds: Normal breath sounds.  Musculoskeletal:     Right lower leg: No edema.     Left lower leg:  No edema.  Skin:    General: Skin is warm and dry.  Neurological:     General: No focal deficit present.     Mental Status: He is alert and oriented to person, place, and time.  Psychiatric:        Mood and Affect: Mood normal.        Behavior: Behavior normal.     No results found for any visits on 11/02/24.      Assessment & Plan:   Alec Clark was seen today for medical management of chronic issues.  Diagnoses and all orders for this visit:  Anxiety -     sertraline (ZOLOFT) 25 MG tablet; Take 1 tablet (25 mg total) by mouth daily.  Depression, major, single episode, moderate (HCC) -     sertraline (ZOLOFT) 25 MG tablet; Take 1 tablet (25 mg total) by mouth daily.  Chronic insomnia -     traZODone  (DESYREL ) 50 MG tablet; Take 0.5-1 tablets (25-50 mg total) by mouth at bedtime.  Chronic midline low back pain without sciatica -     methocarbamol  (ROBAXIN -750) 750 MG tablet; Take 1 tablet (750 mg total) by  mouth 4 (four) times daily. -     meloxicam  (MOBIC ) 15 MG tablet; Take 1 tablet (15 mg total) by mouth daily.  Chronic neck pain -     meloxicam  (MOBIC ) 15 MG tablet; Take 1 tablet (15 mg total) by mouth daily.  Chronic pain of both feet -     meloxicam  (MOBIC ) 15 MG tablet; Take 1 tablet (15 mg total) by mouth daily.  Erectile dysfunction, unspecified erectile dysfunction type -     Ambulatory referral to Urology  Low libido -     Ambulatory referral to Urology   Assessment and Plan    Anxiety and Depression Chronic insomnia Symptoms improved slightly without medication. Discussed trial and error of another SSRI and potential side effects. - Prescribed Zoloft 25 mg daily. - Follow up in six weeks to assess response. - Continue trazodone  prn for sleep  Male Erectile Dysfunction and Decreased Libido, history of low testosterone Possibly related to anxiety, depression, or low testosterone. Previous trials of Cialis and Viagra with variable success. - Referred to urologist in [place] for further evaluation.  Low Back Pain with Sciatica Chronic foot pain Chronic neck pain Managed with meloxicam . - Continue meloxicam  for inflammation. - continue methocarbamol  prn for spasms      Return in about 6 weeks (around 12/14/2024) for medication follow up.  The patient indicates understanding of these issues and agrees with the plan.  Annabella CHRISTELLA Search, FNP

## 2024-11-07 ENCOUNTER — Telehealth: Payer: Self-pay

## 2024-11-07 NOTE — Telephone Encounter (Signed)
 Called patient to reschedule appointment to [place]. Unable to connect with patient. Left vm for patient to call us  back to have appointment rescheduled.

## 2024-11-14 ENCOUNTER — Encounter

## 2024-12-16 ENCOUNTER — Ambulatory Visit: Admitting: Family Medicine

## 2024-12-23 ENCOUNTER — Other Ambulatory Visit

## 2025-01-30 ENCOUNTER — Ambulatory Visit: Admitting: Urology
